# Patient Record
Sex: Female | Born: 1980
Health system: Southern US, Community
[De-identification: ages and names within clinical notes are randomized; demographics above are authoritative.]

## PROBLEM LIST (undated history)

## (undated) DIAGNOSIS — Z8489 Family history of other specified conditions: Secondary | ICD-10-CM

## (undated) DIAGNOSIS — L409 Psoriasis, unspecified: Secondary | ICD-10-CM

## (undated) DIAGNOSIS — Z9189 Other specified personal risk factors, not elsewhere classified: Secondary | ICD-10-CM

## (undated) DIAGNOSIS — K921 Melena: Secondary | ICD-10-CM

## (undated) HISTORY — DX: Other specified personal risk factors, not elsewhere classified: Z91.89

## (undated) HISTORY — DX: Melena: K92.1

## (undated) HISTORY — DX: Psoriasis, unspecified: L40.9

---
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
## 2006-07-22 ENCOUNTER — Emergency Department (HOSPITAL_COMMUNITY): Admission: EM | Admit: 2006-07-22 | Discharge: 2006-07-22 | Payer: Self-pay | Admitting: Family Medicine

## 2009-07-22 ENCOUNTER — Other Ambulatory Visit: Admission: RE | Admit: 2009-07-22 | Discharge: 2009-07-22 | Payer: Self-pay | Admitting: Family Medicine

## 2017-09-21 ENCOUNTER — Encounter: Payer: Self-pay | Admitting: Family Medicine

## 2017-09-21 ENCOUNTER — Ambulatory Visit: Payer: 59 | Admitting: Family Medicine

## 2017-09-21 VITALS — BP 105/72 | HR 65 | Resp 16 | Ht 62.0 in | Wt 151.0 lb

## 2017-09-21 DIAGNOSIS — R635 Abnormal weight gain: Secondary | ICD-10-CM | POA: Diagnosis not present

## 2017-09-21 DIAGNOSIS — Z7689 Persons encountering health services in other specified circumstances: Secondary | ICD-10-CM

## 2017-09-21 DIAGNOSIS — M791 Myalgia, unspecified site: Secondary | ICD-10-CM | POA: Diagnosis not present

## 2017-09-21 DIAGNOSIS — W57XXXA Bitten or stung by nonvenomous insect and other nonvenomous arthropods, initial encounter: Secondary | ICD-10-CM

## 2017-09-21 DIAGNOSIS — M2550 Pain in unspecified joint: Secondary | ICD-10-CM

## 2017-09-21 DIAGNOSIS — R5383 Other fatigue: Secondary | ICD-10-CM

## 2017-09-21 DIAGNOSIS — Z3009 Encounter for other general counseling and advice on contraception: Secondary | ICD-10-CM

## 2017-09-21 DIAGNOSIS — L409 Psoriasis, unspecified: Secondary | ICD-10-CM

## 2017-09-21 NOTE — Progress Notes (Signed)
Patient ID: Rebecca Pacheco, female  DOB: 02-10-1981, 37 y.o.   MRN: [id number] Patient Care Team    Relationship Specialty Notifications Start End  Ma Hillock, DO PCP - General Family Medicine  09/21/17     Chief Complaint  Patient presents with  . Establish Care    Subjective:  Rebecca Pacheco is a 37 y.o.  female present for new patient establishment. All past medical history, surgical history, allergies, family history, immunizations, medications and social history were updatedin the electronic medical record today. All recent labs, ED visits and hospitalizations within the last year were reviewed.  Fatigue, polyarthralgia, myalgia: Patient presents today with 6 months worsening symptoms of her psoriasis which she states she has had for years.  She has not wanted to take a daily med in the past.  No records provided today.  She reports she is fearful she has been exposed to Lyme disease secondary to multiple tick bites over the last few months.  Takes precautions when out in her yard, however she has pets which she feels the takes into the house.  She reports her knees ankles and arms are painful in the joints.  She also complains of feeling like she is "run a marathon "with her leg muscles being sore.  She denies fever, chills, headaches or rash other than her psoriasis.  She has a family history of lupus and she is worried she has lupus, or another autoimmune disorder.  By review of electronic medical record she had an ANA completed September 22, 2014 which was positive for titers of 1: 320 speckled pattern, Smith antibodies, Sjogren's antibodies, anti-scleroderma-70 antibodies, anti-DNA double-stranded antibodies and anti-Jo antibodies are all negative.    No flowsheet data found. No flowsheet data found.    No flowsheet data found.  There is no immunization history on file for this patient.  No exam data present  Past Medical History:  Diagnosis Date  . Blood in stool     anal tear  . History of fainting spells of unknown cause   . Psoriasis    Allergies  Allergen Reactions  . Poison [employer] and Itching  . Lidocaine Hcl Other (See Comments)    BP & Heart Rate "bottom out"  . Mango Flavor Rash    Rash around lips  . Poison Ivy Extract   . Proparacaine     hypotension   Past Surgical History:  Procedure Laterality Date  . TONSILLECTOMY  1987   Family History  Problem Relation Age of Onset  . Arthritis Mother   . Asthma Mother   . Hypertension Mother   . Hyperlipidemia Father   . Hearing loss Father   . Cancer Brother   . Hypertension Brother   . Early death Maternal Grandmother   . Early death Maternal Grandfather   . Cancer Maternal Grandfather   . Arthritis Paternal Grandmother   . Arthritis Paternal Grandfather   . Hearing loss Paternal Grandfather   . Heart disease Paternal Grandfather   . Hyperlipidemia Paternal Grandfather    Social History   Socioeconomic History  . Marital status: Single    Spouse name: Not on file  . Number of children: Not on file  . Years of education: Not on file  . Highest education level: Not on file  Occupational History  . Not on file  Social Needs  . Financial resource strain: Not on file  . Food insecurity:    Worry: Not on file  Inability: Not on file  . Transportation needs:    Medical: Not on file    Non-medical: Not on file  Tobacco Use  . Smoking status: Never Smoker  . Smokeless tobacco: Never Used  Substance and Sexual Activity  . Alcohol use: Yes    Alcohol/week: 1.2 oz    Types: 2 Glasses of wine per week  . Drug use: Never  . Sexual activity: Not on file  Lifestyle  . Physical activity:    Days per week: Not on file    Minutes per session: Not on file  . Stress: Not on file  Relationships  . Social connections:    Talks on phone: Not on file    Gets together: Not on file    Attends religious service: Not on file    Active member of club or organization:  Not on file    Attends meetings of clubs or organizations: Not on file    Relationship status: Not on file  . Intimate partner violence:    Fear of current or ex partner: Not on file    Emotionally abused: Not on file    Physically abused: Not on file    Forced sexual activity: Not on file  Other Topics Concern  . Not on file  Social History Narrative  . Not on file   Allergies as of 09/21/2017      Reactions   Poison Oak Extract Hives, Itching   Lidocaine Hcl Other (See Comments)   BP & Heart Rate "bottom out"   Mango Flavor Rash   Rash around lips   Poison Ivy Extract    Proparacaine    hypotension      Medication List        Accurate as of 09/21/17  5:48 PM. Always use your most recent med list.          [employer] FIBER GUMMIES PO Take by mouth.   desogestrel-ethinyl estradiol 0.15-30 MG-MCG tablet Commonly known as:  APRI,EMOQUETTE,SOLIA TAKE 1 TABLET BY MOUTH EVERY DAY   THERA Tabs Take by mouth.       All past medical history, surgical history, allergies, family history, immunizations andmedications were updated in the EMR today and reviewed under the history and medication portions of their EMR.    No results found for this or any previous visit (from the past 2160 hour(s)).  No results found.   ROS: 14 pt review of systems performed and negative (unless mentioned in an HPI)  Objective: BP 105/72 (BP Location: Right Arm, Patient Position: Sitting, Cuff Size: Normal)   Pulse 65   Resp 16   Ht 5' 2"  (1.575 m)   Wt 151 lb (68.5 kg)   LMP 09/09/2017 Comment: on birth ontrol  SpO2 100%   BMI 27.62 kg/m  Gen: Afebrile. No acute distress. Nontoxic in appearance, well-developed, well-nourished, pleasant Caucasian female. HENT: AT. [place]. Bilateral TM visualized and normal in appearance, normal external auditory canal. MMM, no oral lesions, adequate dentition. Bilateral nares within normal limits. Throat without erythema, ulcerations or exudates. no Cough on exam,  no hoarseness on exam. Eyes:Pupils Equal Round Reactive to light, Extraocular movements intact,  Conjunctiva without redness, discharge or icterus. Neck/lymp/endocrine: Supple,no lymphadenopathy, no thyromegaly CV: RRR no murmur, no edema Chest: CTAB, no wheeze, rhonchi or crackles.  Abd: Soft.NTND. BS present. Skin: no rashes, purpura or petechiae.  Dry flaky scalp present. warm and well-perfused. Skin intact. Neuro/Msk: Normal gait. PERLA. EOMi. Alert. Oriented x3.   Psych:  Normal affect, dress and demeanor. Normal speech. Normal thought content and judgment.   Assessment/plan: Summers Buendia is a 37 y.o. female present for EST care.  Myalgia/polyarthralgia/history of psoriasis/fatigue/weight gain -Lengthy discussion with her today surrounding her constellation of symptoms.  Could certainly be autoimmune disorder.  She reports psoriasis history.  She has a positive ANA 1: 320 speckled pattern reviewed in care everywhere.  Exam today is benign.  Will pursue further inflammatory autoimmune work-up and likely refer to rheumatology for further investigation on her symptoms. -Could consider daily anti-inflammatory versus Cymbalta, depending upon labs given patient's desire for medications, which she aexpressed not wanting to take a daily med.  - HLA-B27 Antigen - Sedimentation rate - C-reactive protein - TSH - Rheumatoid Factor - Cyclic citrul peptide antibody, IgG - CBC - B. burgdorfi antibodies - Comp Met (CMET) - Iron, TIBC and Ferritin Panel - Vitamin D (25 hydroxy) - B12 -Follow-up dependent upon lab results.  Family planning advice: -She is currently on birth control pills.  Pap is overdue.  She states that she desires to have a child.  Her husband is not quite ready yet.  He is currently living in [place] patient to become established with a OB/GYN in order to update her Pap and discuss family planning in more detail.  She has been on the birth control  pill for 15 years and may need assistance with pregnancy secondary to advanced maternal age.  She is agreeable to become established with OB/GYN.  Return if symptoms worsen or fail to improve.   Note is dictated utilizing voice recognition software. Although note has been proof read prior to signing, occasional typographical errors still can be missed. If any questions arise, please do not hesitate to call for verification.  Electronically signed by: Howard Pouch, DO [place]

## 2017-09-21 NOTE — Patient Instructions (Signed)
We will complete labs today. We will call you in about  A week with results. It was a pleasure meeting you today.  We may need to refer you to dermatology or rheumatology for further evaluation.   Psoriasis Psoriasis is a long-term (chronic) skin condition. It causes raised, red patches (plaques) on your skin that look silvery. The red patches may show up anywhere on your body. They can be any size or shape. Psoriasis can come and go. It can range from mild to very bad. It cannot be passed from one person to another (not contagious). There is no cure for this condition, but it can be helped with treatment. Follow these instructions at home: Skin Care  Apply moisturizers to your skin as needed. Only use those that your doctor has said are okay.  Apply cool compresses to the affected areas.  Do not scratch your skin. Lifestyle   Do not use tobacco products. This includes cigarettes, chewing tobacco, and e-cigarettes. If you need help quitting, ask your doctor.  Drink little or no alcohol.  Try to lower your stress. Meditation or yoga may help.  Get sun as told by your doctor. Do not get sunburned.  Think about joining a psoriasis support group. Medicines  Take or use over-the-counter and prescription medicines only as told by your doctor.  If you were prescribed an antibiotic, take or use it as told by your doctor. Do not stop taking the antibiotic even if your condition starts to get better. General instructions  Keep a journal. Use this to help track what triggers an outbreak. Try to avoid any triggers.  See a counselor or social worker if you feel very sad, upset, or hopeless about your condition and these feelings affect your work or relationships.  Keep all follow-up visits as told by your doctor. This is important. Contact a doctor if:  Your pain gets worse.  You have more redness or warmth in the affected areas.  You have new pain or stiffness in your  joints.  Your pain or stiffness in your joints gets worse.  Your nails start to break easily.  Your nails pull away from the nail bed easily.  You have a fever.  You feel very sad (depressed). This information is not intended to replace advice given to you by your health care provider. Make sure you discuss any questions you have with your health care provider. Document Released: 04/20/2004 Document Revised: 08/19/2015 Document Reviewed: 07/29/2014 Elsevier Interactive Patient Education  2018 [employer]

## 2017-09-24 LAB — CBC
HCT: 38 % (ref 35.0–45.0)
Hemoglobin: 12.8 g/dL (ref 11.7–15.5)
MCH: 30.3 pg (ref 27.0–33.0)
MCHC: 33.7 g/dL (ref 32.0–36.0)
MCV: 89.8 fL (ref 80.0–100.0)
MPV: 10.7 fL (ref 7.5–12.5)
Platelets: 300 10*3/uL (ref 140–400)
RBC: 4.23 10*6/uL (ref 3.80–5.10)
RDW: 12.2 % (ref 11.0–15.0)
WBC: 7.4 10*3/uL (ref 3.8–10.8)

## 2017-09-24 LAB — SEDIMENTATION RATE: Sed Rate: 34 mm/h — ABNORMAL HIGH (ref 0–20)

## 2017-09-24 LAB — COMPREHENSIVE METABOLIC PANEL
AG Ratio: 1.5 (calc) (ref 1.0–2.5)
ALT: 16 U/L (ref 6–29)
AST: 17 U/L (ref 10–30)
Albumin: 4.3 g/dL (ref 3.6–5.1)
Alkaline phosphatase (APISO): 56 U/L (ref 33–115)
BUN: 9 mg/dL (ref 7–25)
CO2: 21 mmol/L (ref 20–32)
Calcium: 9.7 mg/dL (ref 8.6–10.2)
Chloride: 103 mmol/L (ref 98–110)
Creat: 0.62 mg/dL (ref 0.50–1.10)
Globulin: 2.9 g/dL (calc) (ref 1.9–3.7)
Glucose, Bld: 88 mg/dL (ref 65–99)
Potassium: 4.2 mmol/L (ref 3.5–5.3)
Sodium: 138 mmol/L (ref 135–146)
Total Bilirubin: 0.3 mg/dL (ref 0.2–1.2)
Total Protein: 7.2 g/dL (ref 6.1–8.1)

## 2017-09-24 LAB — B. BURGDORFI ANTIBODIES: B burgdorferi Ab IgG+IgM: <0.9 index

## 2017-09-24 LAB — IRON,TIBC AND FERRITIN PANEL
%SAT: 13 % (calc) — ABNORMAL LOW (ref 16–45)
Ferritin: 44 ng/mL (ref 16–154)
Iron: 58 ug/dL (ref 40–190)
TIBC: 442 mcg/dL (calc) (ref 250–450)

## 2017-09-24 LAB — RHEUMATOID FACTOR: Rhuematoid fact SerPl-aCnc: <14 IU/mL (ref <14)

## 2017-09-24 LAB — HLA-B27 ANTIGEN: HLA-B27 Antigen: NEGATIVE

## 2017-09-24 LAB — VITAMIN B12: Vitamin B-12: 600 pg/mL (ref 200–1100)

## 2017-09-24 LAB — VITAMIN D 25 HYDROXY (VIT D DEFICIENCY, FRACTURES): Vit D, 25-Hydroxy: 37 ng/mL (ref 30–100)

## 2017-09-24 LAB — C-REACTIVE PROTEIN: CRP: 6.8 mg/L (ref <8.0)

## 2017-09-24 LAB — TSH: TSH: 3.05 mIU/L

## 2017-09-24 LAB — CYCLIC CITRUL PEPTIDE ANTIBODY, IGG: Cyclic Citrullin Peptide Ab: <16 UNITS

## 2017-10-01 ENCOUNTER — Telehealth: Payer: Self-pay | Admitting: Family Medicine

## 2017-10-01 DIAGNOSIS — R5383 Other fatigue: Secondary | ICD-10-CM

## 2017-10-01 DIAGNOSIS — M791 Myalgia, unspecified site: Secondary | ICD-10-CM

## 2017-10-01 DIAGNOSIS — R768 Other specified abnormal immunological findings in serum: Secondary | ICD-10-CM

## 2017-10-01 DIAGNOSIS — L409 Psoriasis, unspecified: Secondary | ICD-10-CM

## 2017-10-01 DIAGNOSIS — M2550 Pain in unspecified joint: Secondary | ICD-10-CM

## 2017-10-01 MED ORDER — MELOXICAM 15 MG PO TABS: 15.0000 mg | ORAL_TABLET | Freq: Every day | ORAL | 1 refills | Status: DC

## 2017-10-01 NOTE — Telephone Encounter (Signed)
Patient notified and verbalized understanding. Patient is in agreement to try the Mobic.

## 2017-10-01 NOTE — Telephone Encounter (Signed)
mobic once daily with food prescribed. Please make pt aware.

## 2017-10-01 NOTE — Telephone Encounter (Signed)
Please inform patient the following information: All of her labs are normal with the exception of one of her inflammatory makers called a sed rate is mildly elevated. With her complaints of myalgia/polyarthralgia, prior positive ANA and elevated sed rate--> I have referred her to rheumatology for further investigation into the causes of her symptoms.   Short term to possibly provide comfort we could consider daily antiinflammatory medication called mobic if she desires. Please advise

## 2017-10-02 NOTE — Telephone Encounter (Signed)
Patient notified

## 2019-05-15 ENCOUNTER — Institutional Professional Consult (permissible substitution): Payer: 59 | Admitting: Plastic Surgery

## 2019-05-29 ENCOUNTER — Ambulatory Visit: Payer: 59 | Admitting: Plastic Surgery

## 2019-05-29 ENCOUNTER — Other Ambulatory Visit: Payer: Self-pay

## 2019-05-29 ENCOUNTER — Encounter: Payer: Self-pay | Admitting: Plastic Surgery

## 2019-05-29 VITALS — BP 114/78 | HR 74 | Temp 98.0°F | Ht 62.0 in | Wt 147.8 lb

## 2019-05-29 DIAGNOSIS — L723 Sebaceous cyst: Secondary | ICD-10-CM | POA: Diagnosis not present
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
## 2019-06-26 ENCOUNTER — Ambulatory Visit: Payer: 59 | Admitting: Plastic Surgery

## 2019-06-26 ENCOUNTER — Encounter: Payer: Self-pay | Admitting: Plastic Surgery

## 2019-06-26 ENCOUNTER — Other Ambulatory Visit: Payer: Self-pay

## 2019-06-26 ENCOUNTER — Other Ambulatory Visit (HOSPITAL_COMMUNITY)
Admission: RE | Admit: 2019-06-26 | Discharge: 2019-06-26 | Disposition: A | Discharge status: Home / Self Care | Payer: 59 | Source: Ambulatory Visit | Attending: Plastic Surgery | Admitting: Plastic Surgery

## 2019-06-26 VITALS — BP 114/77 | HR 68 | Temp 98.0°F | Ht 62.0 in | Wt 148.0 lb

## 2019-06-26 DIAGNOSIS — L723 Sebaceous cyst: Secondary | ICD-10-CM | POA: Insufficient documentation

## 2019-06-26 NOTE — Progress Notes (Signed)
Operative Note   DATE OF OPERATION: 06/26/2019  LOCATION:    SURGICAL DEPARTMENT: Plastic Surgery  PREOPERATIVE DIAGNOSES: Glabellar cyst  POSTOPERATIVE DIAGNOSES:  same  PROCEDURE:  1. Excision of glabellar cyst measuring 1.5 cm 2. Complex closure measuring 1.5 cm  SURGEON: Ancil Linsey, MD  ANESTHESIA:  Local  COMPLICATIONS: None.   INDICATIONS FOR PROCEDURE:  The patient, Rebecca Pacheco is a 39 y.o. female born on 1980-11-24, is here for treatment of symptomatic glabellar cyst MRN: [id number]  CONSENT:  Informed consent was obtained directly from the patient. Risks, benefits and alternatives were fully discussed. Specific risks including but not limited to bleeding, infection, hematoma, seroma, scarring, pain, infection, wound healing problems, and need for further surgery were all discussed. The patient did have an ample opportunity to have questions answered to satisfaction.   DESCRIPTION OF PROCEDURE:  Local anesthesia was administered. The patient's operative site was prepped and draped in a sterile fashion. A time out was performed and all information was confirmed to be correct.  The lesion was excised with a 15 blade.  Hemostasis was obtained.  Circumferential undermining was performed and the skin was advanced and closed in layers with interrupted buried Monocryl sutures and 5-0 fast gut for the skin.  The lesion excised measured 1.5 cm, and the total length of closure measured 1.5 cm.    The patient tolerated the procedure well.  There were no complications.

## 2019-06-27 LAB — SURGICAL PATHOLOGY

## 2019-07-10 ENCOUNTER — Ambulatory Visit: Payer: 59 | Admitting: Plastic Surgery

## 2019-09-15 ENCOUNTER — Encounter: Payer: Self-pay | Admitting: Podiatry

## 2019-09-15 ENCOUNTER — Ambulatory Visit: Payer: 59 | Admitting: Podiatry

## 2019-09-15 ENCOUNTER — Other Ambulatory Visit: Payer: Self-pay

## 2019-09-15 ENCOUNTER — Ambulatory Visit (INDEPENDENT_AMBULATORY_CARE_PROVIDER_SITE_OTHER): Payer: 59

## 2019-09-15 DIAGNOSIS — M205X2 Other deformities of toe(s) (acquired), left foot: Secondary | ICD-10-CM

## 2019-09-15 DIAGNOSIS — M7752 Other enthesopathy of left foot: Secondary | ICD-10-CM | POA: Insufficient documentation

## 2019-09-15 DIAGNOSIS — M7751 Other enthesopathy of right foot: Secondary | ICD-10-CM | POA: Diagnosis not present

## 2019-09-15 DIAGNOSIS — M19072 Primary osteoarthritis, left ankle and foot: Secondary | ICD-10-CM | POA: Diagnosis not present

## 2019-09-15 MED ORDER — MELOXICAM 15 MG PO TABS
15.0000 mg | ORAL_TABLET | Freq: Every day | ORAL | 0 refills | Status: DC
Start: 2019-09-15 — End: 2020-03-18

## 2019-09-15 NOTE — Progress Notes (Signed)
This patient presents to the office with chief complaint of pain on the inside of both ankles.   This patient says she has been experiencing pain on the inside of left ankle than right.  She says she has started running which has caused her pain.  She says her left ankle is 8/9 out of 10 when her ankle is painful.  Her right ankle is 2/3   She points to the area between her inside ankle bone and achilles tendon.  She denies trauma or injury.  She has cut back her activities and her ankle has improved.    She presents to the office for evaluation and treatment.  Vascular  Dorsalis pedis and posterior tibial pulses are palpable  B/L.  Capillary return  WNL.  Temperature gradient is  WNL.  Skin turgor  WNL  Sensorium  Senn Weinstein monofilament wire  WNL. Normal tactile sensation.  Muscle power WNL except weakness on inversion left foot.  Nail Exam  Patient has normal nails with no evidence of bacterial or fungal infection.  Orthopedic  Exam  Muscle tone and muscle strength  WNL.  No limitations of motion feet  B/L.  No crepitus or joint effusion noted.  Foot type is unremarkable and digits show no abnormalities.  Hallux limitus 1st MPJ  B/L. Excessive pronation upon weight bearing. Pain along the PTT behind medial malleolus    Left greater than right.  Plantar flexed hallux position 1st MPJ left with hammer toes 2-4  Left.  Skin  No open lesions.  Normal skin texture and turgor.  Ankle tendinitis  B/L.  Hallux limitus 1st MPJ  B/L.  IE.  Xrays taken reveal no bony pathology left ankle.  Mild dorsal crowning 1st MPJ  Left.  Discussed condition with this patient.    Patient has excessive  pronation with hallux limitus 1st MPJ left.  Prescribe  Mobic 15 mg  # 30.  Dispense powerstep insoles.  RTC 10 days.     Helane Gunther DPM

## 2019-09-25 ENCOUNTER — Other Ambulatory Visit: Payer: Self-pay

## 2019-09-25 ENCOUNTER — Ambulatory Visit: Payer: 59 | Admitting: Podiatry

## 2019-09-25 DIAGNOSIS — M7752 Other enthesopathy of left foot: Secondary | ICD-10-CM | POA: Diagnosis not present

## 2019-09-25 DIAGNOSIS — M7751 Other enthesopathy of right foot: Secondary | ICD-10-CM

## 2019-09-25 DIAGNOSIS — M205X2 Other deformities of toe(s) (acquired), left foot: Secondary | ICD-10-CM | POA: Diagnosis not present

## 2019-09-25 NOTE — Progress Notes (Signed)
This patient presents the office for continued evaluation of her ankles.  She was seen approximately 10 to 14 days ago and diagnosed with a tendinitis on her ankles.  She also was diagnosed with a functional hallux limitus.  She was dispensed power step insoles which she has worn and she says that her feet are doing much better using the power step insoles.  She says  she is using them when she exercises and only once  She has  experiences pain with the power step insoles when she walked 5 miles.  She says she has not taken the Mobic which was prescribed since she is now pregnant.  She is very pleased with her improvement and presents the office today for follow-up of evaluation and treatment.  Vascular  Dorsalis pedis and posterior tibial pulses are palpable  B/L.  Capillary return  WNL.  Temperature gradient is  WNL.  Skin turgor  WNL  Sensorium  Senn Weinstein monofilament wire  WNL. Normal tactile sensation.  Nail Exam  Patient has normal nails with no evidence of bacterial or fungal infection.  Orthopedic  Exam  Muscle tone and muscle strength  WNL.  No limitations of motion feet  B/L.  No crepitus or joint effusion noted.  Foot type is unremarkable and digits show no abnormalities.  Hallux limitus 1st MPJ  B/L.  Pain resolved along PTT  B/L.    Skin  No open lesions.  Normal skin texture and turgor.  PTT tendinitis.  Hallux limitus  1st MPJ  B/L.  ROV.  Discussed continued treatment for this patient and told her to make an appointment with the pedorthist  to acquire a kinetic wedge orthoses to be worn in her shoes.  Return to clinic as needed  Helane Gunther DPM

## 2019-10-14 ENCOUNTER — Other Ambulatory Visit: Payer: 59 | Admitting: Orthotics

## 2019-10-23 LAB — OB RESULTS CONSOLE HIV ANTIBODY (ROUTINE TESTING): HIV: NONREACTIVE

## 2019-10-23 LAB — OB RESULTS CONSOLE RPR: RPR: NONREACTIVE

## 2019-10-23 LAB — OB RESULTS CONSOLE ABO/RH: RH Type: POSITIVE

## 2019-10-23 LAB — OB RESULTS CONSOLE RUBELLA ANTIBODY, IGM: Rubella: IMMUNE

## 2019-10-23 LAB — OB RESULTS CONSOLE HEPATITIS B SURFACE ANTIGEN: Hepatitis B Surface Ag: NEGATIVE

## 2019-11-13 LAB — OB RESULTS CONSOLE GC/CHLAMYDIA
Chlamydia: NEGATIVE
Gonorrhea: NEGATIVE

## 2019-12-26 ENCOUNTER — Ambulatory Visit: Payer: Self-pay

## 2019-12-26 ENCOUNTER — Other Ambulatory Visit: Payer: Self-pay

## 2019-12-26 ENCOUNTER — Ambulatory Visit: Payer: 59 | Admitting: Podiatry

## 2019-12-26 ENCOUNTER — Other Ambulatory Visit: Payer: Self-pay | Admitting: Occupational Medicine

## 2019-12-26 DIAGNOSIS — M25571 Pain in right ankle and joints of right foot: Secondary | ICD-10-CM

## 2020-03-18 ENCOUNTER — Inpatient Hospital Stay (HOSPITAL_COMMUNITY)
Admission: AD | Admit: 2020-03-18 | Discharge: 2020-03-18 | Disposition: A | Discharge status: Home / Self Care | Payer: 59 | Attending: Obstetrics and Gynecology | Admitting: Obstetrics and Gynecology

## 2020-03-18 ENCOUNTER — Inpatient Hospital Stay (HOSPITAL_BASED_OUTPATIENT_CLINIC_OR_DEPARTMENT_OTHER): Payer: 59

## 2020-03-18 ENCOUNTER — Encounter (HOSPITAL_COMMUNITY): Payer: Self-pay | Admitting: Obstetrics and Gynecology

## 2020-03-18 DIAGNOSIS — O99891 Other specified diseases and conditions complicating pregnancy: Secondary | ICD-10-CM

## 2020-03-18 DIAGNOSIS — Z791 Long term (current) use of non-steroidal anti-inflammatories (NSAID): Secondary | ICD-10-CM | POA: Diagnosis not present

## 2020-03-18 DIAGNOSIS — M25511 Pain in right shoulder: Secondary | ICD-10-CM | POA: Diagnosis not present

## 2020-03-18 DIAGNOSIS — S4991XA Unspecified injury of right shoulder and upper arm, initial encounter: Secondary | ICD-10-CM | POA: Insufficient documentation

## 2020-03-18 DIAGNOSIS — Z3A30 30 weeks gestation of pregnancy: Secondary | ICD-10-CM

## 2020-03-18 DIAGNOSIS — S3981XA Other specified injuries of abdomen, initial encounter: Secondary | ICD-10-CM | POA: Diagnosis not present

## 2020-03-18 DIAGNOSIS — Z3689 Encounter for other specified antenatal screening: Secondary | ICD-10-CM

## 2020-03-18 DIAGNOSIS — R109 Unspecified abdominal pain: Secondary | ICD-10-CM | POA: Diagnosis not present

## 2020-03-18 DIAGNOSIS — R102 Pelvic and perineal pain: Secondary | ICD-10-CM | POA: Diagnosis not present

## 2020-03-18 DIAGNOSIS — O26899 Other specified pregnancy related conditions, unspecified trimester: Secondary | ICD-10-CM

## 2020-03-18 DIAGNOSIS — R1032 Left lower quadrant pain: Secondary | ICD-10-CM | POA: Diagnosis not present

## 2020-03-18 DIAGNOSIS — Y92410 Unspecified street and highway as the place of occurrence of the external cause: Secondary | ICD-10-CM | POA: Insufficient documentation

## 2020-03-18 MED ORDER — ACETAMINOPHEN 500 MG PO TABS
1000.0000 mg | ORAL_TABLET | Freq: Four times a day (QID) | ORAL | Status: DC | PRN
  Administered 2020-03-18: 1000 mg via ORAL
  Filled 2020-03-18: qty 2

## 2020-03-18 MED ORDER — LACTATED RINGERS IV BOLUS
1000.0000 mL | Freq: Once | INTRAVENOUS | Status: AC
  Administered 2020-03-18: 1000 mL via INTRAVENOUS
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
## 2020-03-18 NOTE — Discharge Instructions (Signed)
Fetal Movement Counts Patient Name: ________________________________________________ Patient Due Date: ____________________ What is a fetal movement count?  A fetal movement count is the number of times that you feel your baby move during a certain amount of time. This may also be called a fetal kick count. A fetal movement count is recommended for every pregnant woman. You may be asked to start counting fetal movements as early as week 28 of your pregnancy. Pay attention to when your baby is most active. You may notice your baby's sleep and wake cycles. You may also notice things that make your baby move more. You should do a fetal movement count:  When your baby is normally most active.  At the same time each day. A good time to count movements is while you are resting, after having something to eat and drink. How do I count fetal movements? 1. Find a quiet, comfortable area. Sit, or lie down on your side. 2. Write down the date, the start time and stop time, and the number of movements that you felt between those two times. Take this information with you to your health care visits. 3. Write down your start time when you feel the first movement. 4. Count kicks, flutters, swishes, rolls, and jabs. You should feel at least 10 movements. 5. You may stop counting after you have felt 10 movements, or if you have been counting for 2 hours. Write down the stop time. 6. If you do not feel 10 movements in 2 hours, contact your health care provider for further instructions. Your health care provider may want to do additional tests to assess your baby's well-being. Contact a health care provider if:  You feel fewer than 10 movements in 2 hours.  Your baby is not moving like he or she usually does. Date: ____________ Start time: ____________ Stop time: ____________ Movements: ____________ Date: ____________ Start time: ____________ Stop time: ____________ Movements: ____________ Date: ____________  Start time: ____________ Stop time: ____________ Movements: ____________ Date: ____________ Start time: ____________ Stop time: ____________ Movements: ____________ Date: ____________ Start time: ____________ Stop time: ____________ Movements: ____________ Date: ____________ Start time: ____________ Stop time: ____________ Movements: ____________ Date: ____________ Start time: ____________ Stop time: ____________ Movements: ____________ Date: ____________ Start time: ____________ Stop time: ____________ Movements: ____________ Date: ____________ Start time: ____________ Stop time: ____________ Movements: ____________ This information is not intended to replace advice given to you by your health care provider. Make sure you discuss any questions you have with your health care provider. Document Revised: 10/31/2018 Document Reviewed: 10/31/2018 Elsevier Patient Education  2020 [employer] Inc[employer]   Preventing Injuries During Pregnancy  Injuries can happen during pregnancy. Minor falls and accidents usually do not harm you or your baby. But some injuries can harm you and your baby. Tell your doctor about any injury you suffer. What can I do to avoid injuries? Safety  Remove rugs and loose objects on the floor.  Wear comfortable shoes that have a good grip. Do not wear shoes that have high heels.  Always wear your seat belt in the car. The lap belt should be below your belly. Always drive safely.  Do not ride on a motorcycle. Activity  Do not take part in rough and violent activities or sports.  Avoid: ? Walking on wet or slippery floors. ? Lifting heavy pots of boiling or hot liquids. ? Fixing electrical problems. ? Being near fires. General instructions  Take over-the-counter and prescription medicines only as told by your doctor.  Know your   blood type and the blood type of the baby's father.  If you are a victim of domestic violence: ? Call your local emergency services (911 in the  [place] ? Contact the [employer] Violence Hotline for help and support. Get help right away if:  You fall on your belly or receive any serious blow to your belly.  You have a stiff neck or neck pain after a fall or an injury.  You get a headache or have problems with vision after an injury.  You do not feel the baby move or the baby is not moving as much as normal.  You have been a victim of domestic violence or any other kind of attack.  You have been in a car accident.  You have bleeding from your vagina.  Fluid is leaking from your vagina.  You start to have cramping or pain in your belly (contractions).  You have very bad pain in your lower back.  You feel weak or pass out (faint).  You start to throw up (vomit) after an injury.  You have been burned. Summary  Some injuries that happen during pregnancy can do harm to the baby.  Tell your doctor about any injury.  Take steps to avoid injury. This includes removing rugs and loose objects on the floor. Always wear your seat belt in the car.  Do not take part in rough and violent activities or sports.  Get help right away if you have any serious accident or injury. This information is not intended to replace advice given to you by your health care provider. Make sure you discuss any questions you have with your health care provider. Document Revised: 12/06/2018 Document Reviewed: 03/22/2016 Elsevier Patient Education  2020 [employer].
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
## 2020-04-27 LAB — OB RESULTS CONSOLE GBS: GBS: NEGATIVE

## 2020-05-04 ENCOUNTER — Encounter (HOSPITAL_COMMUNITY): Payer: Self-pay | Admitting: *Deleted

## 2020-05-04 NOTE — Patient Instructions (Signed)
Vickey Boak  05/04/2020   Your procedure is scheduled on:  05/15/2020  Arrive at 0730 at [employer] [facility] on [address] at [facility]  and [employer]. You are invited to use the FREE valet parking or use the Visitor's parking deck.  Pick up the phone at the desk and dial [phone].  Call this number if you have problems the morning of surgery: [phone]  Remember:   Do not eat food:(After Midnight) Desps de medianoche.  Do not drink clear liquids: (After Midnight) Desps de medianoche.  Take these medicines the morning of surgery with A SIP OF WATER:  none   Do not wear jewelry, make-up or nail polish.  Do not wear lotions, powders, or perfumes. Do not wear deodorant.  Do not shave 48 hours prior to surgery.  Do not bring valuables to the hospital.  [facility] is not   responsible for any belongings or valuables brought to the hospital.  Contacts, dentures or bridgework may not be worn into surgery.  Leave suitcase in the car. After surgery it may be brought to your room.  For patients admitted to the hospital, checkout time is 11:00 AM the day of              discharge.      Please read over the following fact sheets that you were given:     Preparing for Surgery

## 2020-05-05 ENCOUNTER — Telehealth (HOSPITAL_COMMUNITY): Payer: Self-pay | Admitting: *Deleted

## 2020-05-05 NOTE — Telephone Encounter (Signed)
Preadmission screen  

## 2020-05-06 ENCOUNTER — Encounter (HOSPITAL_COMMUNITY): Payer: Self-pay

## 2020-05-12 ENCOUNTER — Other Ambulatory Visit: Payer: Self-pay | Admitting: Obstetrics and Gynecology

## 2020-05-12 NOTE — H&P (Signed)
Rebecca Pacheco is a 40 y.o. female G1P0 at 38 weeks and 0 days presenting for primary cesarean section due to breech presentation. Pregnancy has been otherwise uncomplicated.. Prenatal care provided by Dr. Gerald Leitz with [facility].    OB History    Gravida  1   Para  0   Term      Preterm      AB  0   Living  0     SAB      IAB      Ectopic      Multiple      Live Births             Past Medical History:  Diagnosis Date  . Blood in stool    anal tear  . Family history of adverse reaction to anesthesia    difficulty intubation  . History of fainting spells of unknown cause   . Psoriasis    Past Surgical History:  Procedure Laterality Date  . TONSILLECTOMY  1987   Family History: family history includes Arthritis in her mother, paternal grandfather, and paternal grandmother; Asthma in her mother; Cancer in her brother and maternal grandfather; Early death in her maternal grandfather and maternal grandmother; Hearing loss in her father and paternal grandfather; Heart disease in her paternal grandfather; Hyperlipidemia in her father and paternal grandfather; Hypertension in her brother and mother. Social History:  reports that she has never smoked. She has never used smokeless tobacco. She reports current alcohol use of about 2.0 standard drinks of alcohol per week. She reports that she does not use drugs.     Maternal Diabetes: No Genetic Screening: Normal Maternal Ultrasounds/Referrals: Normal Fetal Ultrasounds or other Referrals:  None Maternal Substance Abuse:  No Significant Maternal Medications:  Meds include: Other:  Significant Maternal Lab Results:  Group B Strep negative Other Comments:  None  Review of Systems  Constitutional: Negative.   HENT: Negative.   Eyes: Negative.   Respiratory: Negative.   Cardiovascular: Negative.   Gastrointestinal: Negative.   Endocrine: Negative.   Genitourinary: Negative.   Musculoskeletal: Negative.    Skin: Negative.   Allergic/Immunologic: Negative.   Neurological: Negative.   Hematological: Negative.   Psychiatric/Behavioral: Negative.    History   Last menstrual period 05/29/2019. Maternal Exam:  Introitus: Normal vulva.   Physical Exam Vitals reviewed.  Constitutional:      Appearance: Normal appearance.  HENT:     Head: Normocephalic.  Eyes:     Pupils: Pupils are equal, round, and reactive to light.  Cardiovascular:     Rate and Rhythm: Normal rate.     Pulses: Normal pulses.     Heart sounds: Normal heart sounds.  Pulmonary:     Effort: Pulmonary effort is normal.     Breath sounds: Normal breath sounds.  Abdominal:     Tenderness: There is no abdominal tenderness.  Genitourinary:    General: Normal vulva.  Musculoskeletal:        General: Normal range of motion.     Cervical back: Normal range of motion and neck supple.  Skin:    General: Skin is warm and dry.  Neurological:     General: No focal deficit present.     Mental Status: She is alert and oriented to person, place, and time.     Prenatal labs: ABO, Rh: B/Positive/-- (07/29 0000) Antibody:  Negative  Rubella: Immune (07/29 0000) RPR: Nonreactive (07/29 0000)  HBsAg: Negative (07/29 0000)  HIV: Non-reactive (  07/29 0000)  GBS: Negative/-- (02/01 0000)   Assessment/Plan: 39 wks and 0 days with Breech presentation recommend cesarean section.  -. R/B/A of cesarean section discussed with the patient including but not limited to infection, bleeding damage to bowel bladder and baby with the need for further surgery. R/O transfusion HIV/ Hep B&C discussed. Pt voiced understanding and desires to proceed with cesarean section.    Gerald Leitz 05/12/2020, 7:26 PM

## 2020-05-13 ENCOUNTER — Encounter (HOSPITAL_COMMUNITY)
Admission: RE | Admit: 2020-05-13 | Discharge: 2020-05-13 | Disposition: A | Discharge status: Home / Self Care | Payer: 59 | Source: Ambulatory Visit | Attending: Obstetrics and Gynecology | Admitting: Obstetrics and Gynecology

## 2020-05-13 ENCOUNTER — Other Ambulatory Visit: Payer: Self-pay

## 2020-05-13 ENCOUNTER — Other Ambulatory Visit (HOSPITAL_COMMUNITY)
Admission: RE | Admit: 2020-05-13 | Discharge: 2020-05-13 | Disposition: A | Discharge status: Home / Self Care | Payer: 59 | Source: Ambulatory Visit | Attending: Obstetrics and Gynecology | Admitting: Obstetrics and Gynecology

## 2020-05-13 DIAGNOSIS — Z20822 Contact with and (suspected) exposure to covid-19: Secondary | ICD-10-CM | POA: Insufficient documentation

## 2020-05-13 DIAGNOSIS — Z01812 Encounter for preprocedural laboratory examination: Secondary | ICD-10-CM | POA: Insufficient documentation

## 2020-05-13 HISTORY — DX: Family history of other specified conditions: Z84.89

## 2020-05-13 LAB — TYPE AND SCREEN
ABO/RH(D): B POS
Antibody Screen: NEGATIVE

## 2020-05-13 LAB — SARS CORONAVIRUS 2 (TAT 6-24 HRS): SARS Coronavirus 2: NEGATIVE

## 2020-05-13 LAB — CBC
HCT: 35.2 % — ABNORMAL LOW (ref 36.0–46.0)
Hemoglobin: 11.6 g/dL — ABNORMAL LOW (ref 12.0–15.0)
MCH: 30.1 pg (ref 26.0–34.0)
MCHC: 33 g/dL (ref 30.0–36.0)
MCV: 91.2 fL (ref 80.0–100.0)
Platelets: 219 10*3/uL (ref 150–400)
RBC: 3.86 MIL/uL — ABNORMAL LOW (ref 3.87–5.11)
RDW: 13.2 % (ref 11.5–15.5)
WBC: 8.8 10*3/uL (ref 4.0–10.5)
nRBC: 0 % (ref 0.0–0.2)

## 2020-05-14 LAB — RPR: RPR Ser Ql: NONREACTIVE

## 2020-05-14 NOTE — Anesthesia Preprocedure Evaluation (Addendum)
Anesthesia Evaluation  Patient identified by MRN, date of birth, ID band Patient awake    Reviewed: Allergy & Precautions, NPO status , Patient's Chart, lab work & pertinent test results  History of Anesthesia Complications (+) history of anesthetic complications (HR and BP dropped w/ topical anesthesia to eye; has had lidocaine since then for lipoma removal in plastics office )  Airway Mallampati: I  TM Distance: >3 FB Neck ROM: Full    Dental no notable dental hx.    Pulmonary neg pulmonary ROS,    Pulmonary exam normal breath sounds clear to auscultation       Cardiovascular negative cardio ROS Normal cardiovascular exam Rhythm:Regular Rate:Normal     Neuro/Psych negative neurological ROS  negative psych ROS   GI/Hepatic negative GI ROS, Neg liver ROS,   Endo/Other  negative endocrine ROS  Renal/GU negative Renal ROS  negative genitourinary   Musculoskeletal negative musculoskeletal ROS (+)   Abdominal   Peds negative pediatric ROS (+)  Hematology H/H 11.6/38.2, plt 219   Anesthesia Other Findings   Reproductive/Obstetrics (+) Pregnancy Breech presentation                           Anesthesia Physical Anesthesia Plan  ASA: II  Anesthesia Plan: Spinal   Post-op Pain Management:    Induction:   PONV Risk Score and Plan: 3 and Ondansetron, Dexamethasone and Treatment may vary due to age or medical condition  Airway Management Planned: Natural Airway and Nasal Cannula  Additional Equipment: None  Intra-op Plan:   Post-operative Plan:   Informed Consent: I have reviewed the patients History and Physical, chart, labs and discussed the procedure including the risks, benefits and alternatives for the proposed anesthesia with the patient or authorized representative who has indicated his/her understanding and acceptance.       Plan Discussed with: CRNA  Anesthesia Plan  Comments: (Allergy listed to lidocaine + proparacaine- per pt, needed topical anesthesia for removal of foreign body from eye when she was in college and her HR and BP dropped w/ proparacaine, was told to never have "any -caines" again (sounds more like a vagal response). Since then has had removal of superficial mass in plastics office w/ local anesthesia- no problems. )      Anesthesia Quick Evaluation

## 2020-05-15 ENCOUNTER — Inpatient Hospital Stay (HOSPITAL_COMMUNITY): Payer: 59 | Admitting: Anesthesiology

## 2020-05-15 ENCOUNTER — Encounter (HOSPITAL_COMMUNITY): Payer: Self-pay | Admitting: Obstetrics and Gynecology

## 2020-05-15 ENCOUNTER — Encounter (HOSPITAL_COMMUNITY)
Admission: RE | Disposition: A | Discharge status: Home / Self Care | Payer: Self-pay | Source: Home / Self Care | Attending: Obstetrics and Gynecology

## 2020-05-15 ENCOUNTER — Inpatient Hospital Stay (HOSPITAL_COMMUNITY)
Admission: RE | Admit: 2020-05-15 | Discharge: 2020-05-18 | DRG: 788 | Disposition: A | Discharge status: Home / Self Care | Payer: 59 | Attending: Obstetrics and Gynecology | Admitting: Obstetrics and Gynecology

## 2020-05-15 ENCOUNTER — Other Ambulatory Visit: Payer: Self-pay

## 2020-05-15 DIAGNOSIS — O321XX0 Maternal care for breech presentation, not applicable or unspecified: Secondary | ICD-10-CM | POA: Diagnosis present

## 2020-05-15 DIAGNOSIS — Z3A39 39 weeks gestation of pregnancy: Secondary | ICD-10-CM | POA: Diagnosis not present

## 2020-05-15 DIAGNOSIS — Z20822 Contact with and (suspected) exposure to covid-19: Secondary | ICD-10-CM | POA: Diagnosis present

## 2020-05-15 SURGERY — Surgical Case
Anesthesia: Spinal

## 2020-05-15 MED ORDER — OXYCODONE HCL 5 MG/5ML PO SOLN: 5.0000 mg | Freq: Once | ORAL | Status: DC | PRN

## 2020-05-15 MED ORDER — DIPHENHYDRAMINE HCL 25 MG PO CAPS: 25.0000 mg | ORAL_CAPSULE | Freq: Four times a day (QID) | ORAL | Status: DC | PRN

## 2020-05-15 MED ORDER — NALBUPHINE HCL 10 MG/ML IJ SOLN
5.0000 mg | Freq: Once | INTRAMUSCULAR | Status: DC | PRN
Start: 2020-05-15 — End: 2020-05-18

## 2020-05-15 MED ORDER — ONDANSETRON HCL 4 MG/2ML IJ SOLN
INTRAMUSCULAR | Status: AC
  Filled 2020-05-15: qty 2

## 2020-05-15 MED ORDER — FENTANYL CITRATE (PF) 100 MCG/2ML IJ SOLN
INTRAMUSCULAR | Status: DC | PRN
  Administered 2020-05-15: 15 ug via INTRATHECAL

## 2020-05-15 MED ORDER — TETANUS-DIPHTH-ACELL PERTUSSIS 5-2.5-18.5 LF-MCG/0.5 IM SUSY: 0.5000 mL | PREFILLED_SYRINGE | Freq: Once | INTRAMUSCULAR | Status: DC

## 2020-05-15 MED ORDER — ONDANSETRON HCL 4 MG/2ML IJ SOLN
INTRAMUSCULAR | Status: DC | PRN
  Administered 2020-05-15: 4 mg via INTRAVENOUS

## 2020-05-15 MED ORDER — POVIDONE-IODINE 10 % EX SWAB: 2.0000 "application " | Freq: Once | CUTANEOUS | Status: DC

## 2020-05-15 MED ORDER — SCOPOLAMINE 1 MG/3DAYS TD PT72: 1.0000 | MEDICATED_PATCH | Freq: Once | TRANSDERMAL | Status: DC

## 2020-05-15 MED ORDER — MORPHINE SULFATE (PF) 0.5 MG/ML IJ SOLN
INTRAMUSCULAR | Status: AC
  Filled 2020-05-15: qty 10

## 2020-05-15 MED ORDER — ACETAMINOPHEN 500 MG PO TABS
1000.0000 mg | ORAL_TABLET | Freq: Four times a day (QID) | ORAL | Status: AC
  Administered 2020-05-15 – 2020-05-16 (×3): 1000 mg via ORAL
  Filled 2020-05-15 (×3): qty 2

## 2020-05-15 MED ORDER — DEXAMETHASONE SODIUM PHOSPHATE 10 MG/ML IJ SOLN
INTRAMUSCULAR | Status: AC
  Filled 2020-05-15: qty 1

## 2020-05-15 MED ORDER — SODIUM CHLORIDE 0.9% FLUSH: 3.0000 mL | INTRAVENOUS | Status: DC | PRN

## 2020-05-15 MED ORDER — DEXAMETHASONE SODIUM PHOSPHATE 10 MG/ML IJ SOLN
INTRAMUSCULAR | Status: DC | PRN
  Administered 2020-05-15: 10 mg via INTRAVENOUS

## 2020-05-15 MED ORDER — DIBUCAINE (PERIANAL) 1 % EX OINT: 1.0000 "application " | TOPICAL_OINTMENT | CUTANEOUS | Status: DC | PRN

## 2020-05-15 MED ORDER — FENTANYL CITRATE (PF) 100 MCG/2ML IJ SOLN
INTRAMUSCULAR | Status: AC
  Filled 2020-05-15: qty 2

## 2020-05-15 MED ORDER — NALBUPHINE HCL 10 MG/ML IJ SOLN: 5.0000 mg | Freq: Once | INTRAMUSCULAR | Status: DC | PRN

## 2020-05-15 MED ORDER — DIPHENHYDRAMINE HCL 25 MG PO CAPS: 25.0000 mg | ORAL_CAPSULE | ORAL | Status: DC | PRN

## 2020-05-15 MED ORDER — LACTATED RINGERS IV SOLN: INTRAVENOUS | Status: DC

## 2020-05-15 MED ORDER — IBUPROFEN 800 MG PO TABS
800.0000 mg | ORAL_TABLET | Freq: Three times a day (TID) | ORAL | Status: DC
  Administered 2020-05-15 – 2020-05-18 (×8): 800 mg via ORAL
  Filled 2020-05-15 (×8): qty 1

## 2020-05-15 MED ORDER — ACETAMINOPHEN 500 MG PO TABS
1000.0000 mg | ORAL_TABLET | Freq: Three times a day (TID) | ORAL | Status: DC
Start: 2020-05-15 — End: 2020-05-15

## 2020-05-15 MED ORDER — NALBUPHINE HCL 10 MG/ML IJ SOLN: 5.0000 mg | INTRAMUSCULAR | Status: DC | PRN

## 2020-05-15 MED ORDER — CEFAZOLIN SODIUM-DEXTROSE 2-4 GM/100ML-% IV SOLN
INTRAVENOUS | Status: AC
  Filled 2020-05-15: qty 100

## 2020-05-15 MED ORDER — PHENYLEPHRINE HCL-NACL 20-0.9 MG/250ML-% IV SOLN
INTRAVENOUS | Status: DC | PRN
  Administered 2020-05-15: 60 ug/min via INTRAVENOUS

## 2020-05-15 MED ORDER — BUPIVACAINE IN DEXTROSE 0.75-8.25 % IT SOLN
INTRATHECAL | Status: DC | PRN
  Administered 2020-05-15: 1.7 mL via INTRATHECAL

## 2020-05-15 MED ORDER — SOD CITRATE-CITRIC ACID 500-334 MG/5ML PO SOLN
ORAL | Status: AC
  Filled 2020-05-15: qty 30

## 2020-05-15 MED ORDER — OXYTOCIN-SODIUM CHLORIDE 30-0.9 UT/500ML-% IV SOLN
INTRAVENOUS | Status: AC
  Filled 2020-05-15: qty 500

## 2020-05-15 MED ORDER — MEPERIDINE HCL 25 MG/ML IJ SOLN: 6.2500 mg | INTRAMUSCULAR | Status: DC | PRN

## 2020-05-15 MED ORDER — SODIUM CHLORIDE 0.9 % IR SOLN
Status: DC | PRN
  Administered 2020-05-15: 1

## 2020-05-15 MED ORDER — WITCH HAZEL-GLYCERIN EX PADS: 1.0000 "application " | MEDICATED_PAD | CUTANEOUS | Status: DC | PRN

## 2020-05-15 MED ORDER — OXYTOCIN-SODIUM CHLORIDE 30-0.9 UT/500ML-% IV SOLN
2.5000 [IU]/h | INTRAVENOUS | Status: AC
  Administered 2020-05-15: 2.5 [IU]/h via INTRAVENOUS

## 2020-05-15 MED ORDER — OXYCODONE-ACETAMINOPHEN 5-325 MG PO TABS: 1.0000 | ORAL_TABLET | ORAL | Status: DC | PRN

## 2020-05-15 MED ORDER — KETOROLAC TROMETHAMINE 30 MG/ML IJ SOLN
INTRAMUSCULAR | Status: AC
  Filled 2020-05-15: qty 1

## 2020-05-15 MED ORDER — KETOROLAC TROMETHAMINE 30 MG/ML IJ SOLN: 30.0000 mg | Freq: Four times a day (QID) | INTRAMUSCULAR | Status: DC | PRN

## 2020-05-15 MED ORDER — COCONUT OIL OIL
1.0000 "application " | TOPICAL_OIL | Status: DC | PRN
  Administered 2020-05-16: 1 via TOPICAL

## 2020-05-15 MED ORDER — CEFAZOLIN SODIUM-DEXTROSE 2-4 GM/100ML-% IV SOLN
2.0000 g | INTRAVENOUS | Status: AC
  Administered 2020-05-15: 2 g via INTRAVENOUS

## 2020-05-15 MED ORDER — MORPHINE SULFATE (PF) 0.5 MG/ML IJ SOLN
INTRAMUSCULAR | Status: DC | PRN
  Administered 2020-05-15: .15 mg via INTRATHECAL

## 2020-05-15 MED ORDER — PROMETHAZINE HCL 25 MG/ML IJ SOLN: 6.2500 mg | INTRAMUSCULAR | Status: DC | PRN

## 2020-05-15 MED ORDER — MENTHOL 3 MG MT LOZG: 1.0000 | LOZENGE | OROMUCOSAL | Status: DC | PRN

## 2020-05-15 MED ORDER — NALOXONE HCL 0.4 MG/ML IJ SOLN: 0.4000 mg | INTRAMUSCULAR | Status: DC | PRN

## 2020-05-15 MED ORDER — OXYCODONE HCL 5 MG PO TABS: 5.0000 mg | ORAL_TABLET | Freq: Once | ORAL | Status: DC | PRN

## 2020-05-15 MED ORDER — PRENATAL MULTIVITAMIN CH
1.0000 | ORAL_TABLET | Freq: Every day | ORAL | Status: DC
  Administered 2020-05-16 – 2020-05-17 (×2): 1 via ORAL
  Filled 2020-05-15 (×3): qty 1

## 2020-05-15 MED ORDER — KETOROLAC TROMETHAMINE 30 MG/ML IJ SOLN
30.0000 mg | Freq: Once | INTRAMUSCULAR | Status: AC | PRN
  Administered 2020-05-15: 30 mg via INTRAVENOUS

## 2020-05-15 MED ORDER — SIMETHICONE 80 MG PO CHEW
80.0000 mg | CHEWABLE_TABLET | Freq: Three times a day (TID) | ORAL | Status: DC
  Administered 2020-05-15 – 2020-05-17 (×7): 80 mg via ORAL
  Filled 2020-05-15 (×7): qty 1

## 2020-05-15 MED ORDER — NALOXONE HCL 4 MG/10ML IJ SOLN
1.0000 ug/kg/h | INTRAVENOUS | Status: DC | PRN
  Filled 2020-05-15: qty 5

## 2020-05-15 MED ORDER — PHENYLEPHRINE HCL-NACL 20-0.9 MG/250ML-% IV SOLN
INTRAVENOUS | Status: AC
  Filled 2020-05-15: qty 250

## 2020-05-15 MED ORDER — SIMETHICONE 80 MG PO CHEW
80.0000 mg | CHEWABLE_TABLET | ORAL | Status: DC | PRN
Start: 2020-05-15 — End: 2020-05-18

## 2020-05-15 MED ORDER — ONDANSETRON HCL 4 MG/2ML IJ SOLN: 4.0000 mg | Freq: Three times a day (TID) | INTRAMUSCULAR | Status: DC | PRN

## 2020-05-15 MED ORDER — HYDROMORPHONE HCL 1 MG/ML IJ SOLN: 0.2500 mg | INTRAMUSCULAR | Status: DC | PRN

## 2020-05-15 MED ORDER — SOD CITRATE-CITRIC ACID 500-334 MG/5ML PO SOLN
30.0000 mL | ORAL | Status: AC
  Administered 2020-05-15: 30 mL via ORAL

## 2020-05-15 MED ORDER — DIPHENHYDRAMINE HCL 50 MG/ML IJ SOLN: 12.5000 mg | INTRAMUSCULAR | Status: DC | PRN

## 2020-05-15 MED ORDER — SENNOSIDES-DOCUSATE SODIUM 8.6-50 MG PO TABS
2.0000 | ORAL_TABLET | Freq: Every day | ORAL | Status: DC
  Administered 2020-05-16 – 2020-05-17 (×2): 2 via ORAL
  Filled 2020-05-15 (×2): qty 2

## 2020-05-15 MED ORDER — OXYTOCIN-SODIUM CHLORIDE 30-0.9 UT/500ML-% IV SOLN
INTRAVENOUS | Status: DC | PRN
  Administered 2020-05-15: 300 mL via INTRAVENOUS

## 2020-05-15 SURGICAL SUPPLY — 34 items
BARRIER ADHS 3X4 INTERCEED (GAUZE/BANDAGES/DRESSINGS) ×2 IMPLANT
BENZOIN TINCTURE PRP APPL 2/3 (GAUZE/BANDAGES/DRESSINGS) ×2 IMPLANT
CHLORAPREP W/TINT 26ML (MISCELLANEOUS) ×2 IMPLANT
CLAMP CORD UMBIL (MISCELLANEOUS) IMPLANT
CLOSURE STERI STRIP 1/2 X4 (GAUZE/BANDAGES/DRESSINGS) ×2 IMPLANT
CLOTH BEACON ORANGE TIMEOUT ST (SAFETY) ×2 IMPLANT
DRSG OPSITE POSTOP 4X10 (GAUZE/BANDAGES/DRESSINGS) ×2 IMPLANT
ELECT REM PT RETURN 9FT ADLT (ELECTROSURGICAL) ×2
ELECTRODE REM PT RTRN 9FT ADLT (ELECTROSURGICAL) ×1 IMPLANT
EXTRACTOR VACUUM KIWI (MISCELLANEOUS) IMPLANT
GLOVE BIOGEL M 7.0 STRL (GLOVE) ×4 IMPLANT
GLOVE BIOGEL PI IND STRL 7.0 (GLOVE) ×3 IMPLANT
GLOVE BIOGEL PI INDICATOR 7.0 (GLOVE) ×3
GOWN STRL REUS W/TWL LRG LVL3 (GOWN DISPOSABLE) ×6 IMPLANT
KIT ABG SYR 3ML LUER SLIP (SYRINGE) IMPLANT
NEEDLE HYPO 25X5/8 SAFETYGLIDE (NEEDLE) IMPLANT
NS IRRIG 1000ML POUR BTL (IV SOLUTION) ×2 IMPLANT
PACK C SECTION WH (CUSTOM PROCEDURE TRAY) ×2 IMPLANT
PAD OB MATERNITY 4.3X12.25 (PERSONAL CARE ITEMS) ×2 IMPLANT
PENCIL SMOKE EVAC W/HOLSTER (ELECTROSURGICAL) ×2 IMPLANT
RTRCTR C-SECT PINK 25CM LRG (MISCELLANEOUS) IMPLANT
STRIP CLOSURE SKIN 1/2X4 (GAUZE/BANDAGES/DRESSINGS) ×2 IMPLANT
SUT MNCRL 0 VIOLET CTX 36 (SUTURE) ×4 IMPLANT
SUT MONOCRYL 0 CTX 36 (SUTURE) ×8
SUT PDS AB 0 CT1 27 (SUTURE) ×4 IMPLANT
SUT PLAIN 0 NONE (SUTURE) IMPLANT
SUT VIC AB 2-0 CT1 27 (SUTURE) ×4
SUT VIC AB 2-0 CT1 TAPERPNT 27 (SUTURE) ×2 IMPLANT
SUT VIC AB 3-0 SH 27 (SUTURE)
SUT VIC AB 3-0 SH 27X BRD (SUTURE) IMPLANT
SUT VIC AB 4-0 KS 27 (SUTURE) ×2 IMPLANT
TOWEL OR 17X24 6PK STRL BLUE (TOWEL DISPOSABLE) ×2 IMPLANT
TRAY FOLEY W/BAG SLVR 14FR LF (SET/KITS/TRAYS/PACK) ×2 IMPLANT
WATER STERILE IRR 1000ML POUR (IV SOLUTION) ×2 IMPLANT

## 2020-05-15 NOTE — Transfer of Care (Signed)
Immediate Anesthesia Transfer of Care Note  Patient: Rebecca Pacheco  Procedure(s) Performed: CESAREAN SECTION. REQUESTING [facility] TO ASSIST. (N/A )  Patient Location: PACU  Anesthesia Type:Spinal  Level of Consciousness: awake  Airway & Oxygen Therapy: Patient Spontanous Breathing  Post-op Assessment: Report given to RN  Post vital signs: Reviewed and stable  Last Vitals:  Vitals Value Taken Time  BP 108/64 05/15/20 1145  Temp 36.3 C 05/15/20 1145  Pulse 66 05/15/20 1158  Resp 20 05/15/20 1158  SpO2 99 % 05/15/20 1158  Vitals shown include unvalidated device data.  Last Pain:  Vitals:   05/15/20 1145  TempSrc: Axillary  PainSc: 5          Complications: No complications documented.

## 2020-05-15 NOTE — Anesthesia Procedure Notes (Signed)
Spinal  Patient location during procedure: OR Start time: 05/15/2020 9:18 AM End time: 05/15/2020 9:21 AM Staffing Performed: anesthesiologist  Anesthesiologist: Lannie Fields, DO Preanesthetic Checklist Completed: patient identified, IV checked, risks and benefits discussed, surgical consent, monitors and equipment checked, pre-op evaluation and timeout performed Spinal Block Patient position: sitting Prep: DuraPrep and site prepped and draped Patient monitoring: cardiac monitor, continuous pulse ox and blood pressure Approach: midline Location: L3-4 Injection technique: single-shot Needle Needle type: Pencan  Needle gauge: 24 G Needle length: 9 cm Assessment Sensory level: T6 Additional Notes Functioning IV was confirmed and monitors were applied. Sterile prep and drape, including hand hygiene and sterile gloves were used. The patient was positioned and the spine was prepped. The skin was anesthetized with lidocaine.  Free flow of clear CSF was obtained prior to injecting local anesthetic into the CSF.  The spinal needle aspirated freely following injection.  The needle was carefully withdrawn.  The patient tolerated the procedure well.

## 2020-05-15 NOTE — H&P (Signed)
Date of Initial H&P:05/12/2020 History reviewed, patient examined, no change in status, stable for surgery. 

## 2020-05-15 NOTE — Op Note (Signed)
Cesarean Section Procedure Note  Indications: malpresentation: Breech  Pre-operative Diagnosis: 39 week 0 day pregnancy.  Post-operative Diagnosis: same  Surgeon: Gerald Leitz M.D.  Assistants: CNM Rhea Pink assisted due to complexity of the anatomy   Anesthesia: Spinal anesthesia  ASA Class: 2   Procedure Details   The patient was seen in the Holding Room. The risks, benefits, complications, treatment options, and expected outcomes were discussed with the patient.  The patient concurred with the proposed plan, giving informed consent.  The site of surgery properly noted/marked. The patient was taken to Operating Room # B, identified as Rebecca Pacheco and the procedure verified as C-Section Delivery. A Time Out was held and the above information confirmed.  After induction of anesthesia, the patient was draped and prepped in the usual sterile manner. A Pfannenstiel incision was made and carried down through the subcutaneous tissue to the fascia. Fascial incision was made and extended transversely. The fascia was separated from the underlying rectus tissue superiorly and inferiorly. The peritoneum was identified and entered. Peritoneal incision was extended longitudinally. The utero-vesical peritoneal reflection was incised transversely and the bladder flap was bluntly freed from the lower uterine segment. A low transverse uterine incision was made. Delivered from breech Rebecca Pacheco  presentation was a  Female with Apgar scores of 9 at one minute and 9 at five minutes. After the umbilical cord was clamped and cut cord blood was obtained for evaluation. The placenta was removed intact and appeared normal. The uterine outline, tubes and ovaries appeared normal. The uterine incision was closed with running locked sutures of 0 Monocryl.  A second layer of 0 monocryl was used to imbricate the incision. Hemostasis was observed. Lavage was carried out until clear. The peritoneum was reapproximated with 2-0  vicryl.  The fascia was then reapproximated with running sutures of 0 pds. The subcutaneous layer was re-approximated with 2-0 vicryl. The skin was reapproximated with 4-0 vicryl.  Instrument, sponge, and needle counts were correct prior the abdominal closure and at the conclusion of the case.   Findings: Female infant in the breech presentation. Normal appearing fallopian tubes and ovaries   Estimated Blood Loss:  362 mL         Drains: None         Total IV Fluids:  Per anesthesia ml         Specimens: Placenta sent to labor and delivery           Implants: None         Complications:  None; patient tolerated the procedure well.         Disposition: PACU - hemodynamically stable.         Condition: stable  Attending Attestation: I performed the procedure.

## 2020-05-15 NOTE — Lactation Note (Addendum)
This note was copied from a baby's chart. Lactation Consultation Note  Patient Name: Rebecca Pacheco OHYWV'P Date: 05/15/2020 Reason for consult: Initial assessment;Primapara;1st time breastfeeding;Term Age:39 hours  G1P1 mother seen today for initial lactation consult. Baby Rebecca is 6 hours old. Mother informed that she noticed breast changes during pregnancy. She delivered via c/s and stated that she and FOB were able to do STS for a combined 1+ hour in OR and recovery room. Mother expressed that she wants to provided BM. Informed that infant was bale to latch and nurse for ~ 2 minutes after delivery and colostrum noted.   Mother was taught hand expression by [employer] student. Colostrum noted on R and glisten noted on L breast. Mother was able to demonstrate back.   Mother informs she does have a Spectra 1 pump at home. Plans to return to work in 3 weeks, working from home. Mother wishes to start pumping at some point for storage and FOB to bottle feed along with putting infant to breast.   [facility] reviewed expected infant behaviors in first 24 hours, infant feeding cues, infant stomach size, input/output, and hand expression. Parents are aware of additional lactation services, support group, and warm line. Parents expressed that all questions and concerns were answered at this time.    Plan: - Continue to provide lots of STS  - Continue to feed infant on demand, follow infant feeding cues, at least 8-12x in a 24 hours period.  - Continue hand expression prior to feedings  Maternal Data Has patient been taught Hand Expression?: Yes Does the patient have breastfeeding experience prior to this delivery?: No  Feeding Mother's Current Feeding Choice: Breast Milk Interventions Interventions: Breast feeding basics reviewed;Skin to skin;Hand express;Education  Discharge Pump: Personal (At home) [facility] Program: No (Spectra DEBP)  Consult Status Consult Status:  Follow-up Date: 05/16/20 Follow-up type: In-patient    Zola Button 05/15/2020, 4:45 PM

## 2020-05-15 NOTE — Anesthesia Postprocedure Evaluation (Signed)
Anesthesia Post Note  Patient: Rebecca Pacheco  Procedure(s) Performed: CESAREAN SECTION. REQUESTING [facility] TO ASSIST. (N/A )     Patient location during evaluation: PACU Anesthesia Type: Spinal Level of consciousness: awake and alert and oriented Pain management: pain level controlled Vital Signs Assessment: post-procedure vital signs reviewed and stable Respiratory status: spontaneous breathing, nonlabored ventilation and respiratory function stable Cardiovascular status: blood pressure returned to baseline and stable Postop Assessment: no headache, no backache, spinal receding and patient able to bend at knees Anesthetic complications: no   No complications documented.  Last Vitals:  Vitals:   05/15/20 1140 05/15/20 1145  BP:  108/64  Pulse: (!) 58 62  Resp: 14 15  Temp:  (!) 36.3 C  SpO2: 99% 98%    Last Pain:  Vitals:   05/15/20 1145  TempSrc: Axillary  PainSc: 5    Pain Goal:            L Sensory Level: L2-Upper inner thigh, upper buttock (05/15/20 1145) R Sensory Level: L2-Upper inner thigh, upper buttock (05/15/20 1145) Epidural/Spinal Function Cutaneous sensation: Able to Wiggle Toes (05/15/20 1145), Patient able to flex knees: Yes (05/15/20 1145), Patient able to lift hips off bed: No (05/15/20 1145), Back pain beyond tenderness at insertion site: No (05/15/20 1145), Progressively worsening motor and/or sensory loss: No (05/15/20 1145), Bowel and/or bladder incontinence post epidural: No (05/15/20 1145)  Lannie Fields

## 2020-05-16 LAB — CBC
HCT: 28.2 % — ABNORMAL LOW (ref 36.0–46.0)
Hemoglobin: 9.2 g/dL — ABNORMAL LOW (ref 12.0–15.0)
MCH: 30 pg (ref 26.0–34.0)
MCHC: 32.6 g/dL (ref 30.0–36.0)
MCV: 91.9 fL (ref 80.0–100.0)
Platelets: 182 10*3/uL (ref 150–400)
RBC: 3.07 MIL/uL — ABNORMAL LOW (ref 3.87–5.11)
RDW: 13.5 % (ref 11.5–15.5)
WBC: 11.5 10*3/uL — ABNORMAL HIGH (ref 4.0–10.5)
nRBC: 0 % (ref 0.0–0.2)

## 2020-05-16 MED ORDER — ACETAMINOPHEN 325 MG PO TABS
650.0000 mg | ORAL_TABLET | Freq: Four times a day (QID) | ORAL | Status: DC | PRN
  Administered 2020-05-16 – 2020-05-18 (×5): 650 mg via ORAL
  Filled 2020-05-16 (×5): qty 2

## 2020-05-16 MED ORDER — OXYCODONE HCL 5 MG PO TABS: 5.0000 mg | ORAL_TABLET | ORAL | Status: DC | PRN

## 2020-05-16 NOTE — Lactation Note (Addendum)
This note was copied from a baby's chart. Lactation Consultation Note  Patient Name: Girl Nika Yazzie CNOBS'J Date: 05/16/2020 Reason for consult: Follow-up assessment;Mother's request;Difficult latch;1st time breastfeeding;Term Age:39 hours LC entered the room, dad was changing a soiled diaper, per dad, infant had 10 stools and 3 voids since birth. Per mom, infant has not been latching well falls asleep after 2-3 minutes at the breast.  Mom has flat nipples, LC used 20 mm NS and pre-filled with 57ml of mom's EBM using a curve tip syringe, mom latched infant on her right breast using the football hold position. Infant sustained latch and breastfeed for 20 minutes afterwards dad supplemented infant with 7 mls of donor breast milk with slow flow bottle nipple. Mom applied coconut oil to 24 mm breast flange and was using the DEBP, mom understands to pump every 3 hours for 15 minutes on initial setting. After pump mom will wipe coconut oil off and use comfort gels wearing a bra to help sooth sore nipples with mild abrasions. Mom will continue to latch infant according to cues, 8 to 12 or more times within 24 hours, STS. Infant has her own donor breast milk and will be supplemented after each feeding according to BF supplemental guidelines based on infant's age and hours of life.  Mom knows to ask RN or LC for assistance with latch if needed.  Maternal Data    Feeding Mother's Current Feeding Choice: Breast Milk and Donor Milk  LATCH Score Latch: Grasps breast easily, tongue down, lips flanged, rhythmical sucking.  Audible Swallowing: Spontaneous and intermittent  Type of Nipple: Flat  Comfort (Breast/Nipple): Filling, red/small blisters or bruises, mild/mod discomfort  Hold (Positioning): Assistance needed to correctly position infant at breast and maintain latch.  LATCH Score: 7   Lactation Tools Discussed/Used Tools: Pump Nipple shield size: 20 Breast pump type:  Double-Electric Breast Pump Reason for Pumping: Help establish milk supply, infant been poor latcher and sleepy. Pumping frequency: Mom will start pumping every 3 hours for 15 minutes on inital setting, mom put coocnut oil in breast flange to help with soreness  Interventions Interventions: Assisted with latch;Skin to skin;Hand express;Breast compression;Adjust position;Support pillows;Position options;Expressed milk;Coconut oil;Comfort gels;DEBP;Education  Discharge Pump: DEBP;Personal  Consult Status Consult Status: Follow-up Date: 05/17/20 Follow-up type: In-patient    Danelle Earthly 05/16/2020, 9:06 PM

## 2020-05-16 NOTE — Progress Notes (Signed)
Subjective: Postpartum Day 1: Cesarean Delivery Patient reports nausea, vomiting, incisional pain, tolerating PO and no problems voiding.    Objective: Vital signs in last 24 hours: Temp:  [97.6 F (36.4 C)-98.4 F (36.9 C)] 97.9 F (36.6 C) (02/20 0548) Pulse Rate:  [56-71] 69 (02/20 0548) Resp:  [18-20] 18 (02/20 0548) BP: (99-108)/(59) 101/59 (02/20 0548) SpO2:  [97 %-100 %] 100 % (02/20 0548)  Physical Exam:  General: alert, cooperative and no distress Lochia: appropriate Uterine Fundus: firm Incision: healing well DVT Evaluation: No evidence of DVT seen on physical exam.  Recent Labs    05/16/20 0522  HGB 9.2*  HCT 28.2*    Assessment/Plan: Status post Cesarean section. Doing well postoperatively.  Continue current care Pain control - continue ibuprofen scheduled.. d/c percocet changed to acetominophen 650 mg every 6 hours prn and oxycodone 5 mg every 4 hours prn.  Encourage ambulation .  Gerald Leitz 05/16/2020, 1:41 PM

## 2020-05-16 NOTE — Lactation Note (Addendum)
This note was copied from a baby's chart. Lactation Consultation Note  Patient Name: Rebecca Pacheco [id number] Date: 05/16/2020 Reason for consult: Follow-up assessment Age:39 hours  P1, Baby having difficulty sustaining latch.  Baby latches and becomes sleepy. Provided mother with #20NS and attempted in laid back position.   Baby had off and on a few sucks. Mother knows to pump q  3 hours and will consider donor milk if baby continues sleepy latch.   Lactation Tools Discussed/Used Tools: Nipple Dorris Carnes;Pump Nipple shield size: 20 Breast pump type: Double-Electric Breast Pump Reason for Pumping:  (Difficulty latching)  Interventions Interventions: Breast feeding basics reviewed;Assisted with latch;Skin to skin;Hand express;Adjust position    Consult Status Consult Status: Follow-up Date: 05/17/20 Follow-up type: In-patient    [name] [facility] 05/16/2020, 2:54 PM

## 2020-05-17 LAB — BIRTH TISSUE RECOVERY COLLECTION (PLACENTA DONATION)

## 2020-05-17 MED ORDER — OXYCODONE HCL 5 MG PO TABS: 5.0000 mg | ORAL_TABLET | ORAL | 0 refills | Status: DC | PRN

## 2020-05-17 MED ORDER — ACETAMINOPHEN 325 MG PO TABS: 650.0000 mg | ORAL_TABLET | Freq: Four times a day (QID) | ORAL | Status: DC | PRN

## 2020-05-17 MED ORDER — IBUPROFEN 800 MG PO TABS: 800.0000 mg | ORAL_TABLET | Freq: Three times a day (TID) | ORAL | 1 refills | Status: DC | PRN

## 2020-05-17 NOTE — Progress Notes (Signed)
Subjective: Postpartum Day 2: Cesarean Delivery Patient reports incisional pain, tolerating PO and no problems voiding.    Objective: Vital signs in last 24 hours: Temp:  [97.9 F (36.6 C)-98.8 F (37.1 C)] 98.8 F (37.1 C) (02/21 1339) Pulse Rate:  [65-87] 79 (02/21 1339) Resp:  [16-20] 17 (02/21 1339) BP: (100-118)/(52-69) 114/69 (02/21 1339) SpO2:  [98 %-100 %] 98 % (02/21 1339)  Physical Exam:  General: alert, cooperative and no distress Lochia: appropriate Uterine Fundus: firm Incision: healing well DVT Evaluation: No evidence of DVT seen on physical exam.  Recent Labs    05/16/20 0522  HGB 9.2*  HCT 28.2*    Assessment/Plan: Status post Cesarean section. Doing well postoperatively.  Continue current care Routine postpartum care Encourage ambulation D/c home tomorrow .  Gerald Leitz 05/17/2020, 2:56 PM

## 2020-05-17 NOTE — Lactation Note (Signed)
This note was copied from a baby's chart. Lactation Consultation Note  Patient Name: Rebecca Pacheco [id number] Date: 05/17/2020 Reason for consult: Follow-up assessment;1st time breastfeeding Age:39 hours  Suggest mother call for LC to view next feeding and next pumping session. Mother complaining of sore nipples. Wearing comfort gels.  Feeding Nipple Type: Slow - flow  Interventions Interventions: Education;DEBP;Comfort gels;Expressed milk  Discharge Pump: Personal  Consult Status Consult Status: Follow-up Date: 05/17/20 Follow-up type: In-patient   [name] [facility] 05/17/2020, 9:09 AM

## 2020-05-18 MED ORDER — HYDROCORTISONE 1 % EX CREA
TOPICAL_CREAM | Freq: Three times a day (TID) | CUTANEOUS | Status: DC
  Filled 2020-05-18: qty 28

## 2020-05-18 MED ORDER — HYDROCORTISONE 1 % EX CREA: TOPICAL_CREAM | Freq: Three times a day (TID) | CUTANEOUS | 0 refills | Status: DC

## 2020-05-18 NOTE — Lactation Note (Signed)
This note was copied from a baby's chart. Lactation Consultation Note  Patient Name: Rebecca Pacheco [id number] Date: 05/18/2020 Reason for consult: Follow-up assessment Age:39 hours  Mother paged to have latched checked. Mother using a #20 NS. Mother placed nipple shield on the left breast. She prefilled the shield with 3-4 ml of DBM and then placed infant on in cradle hold. Infant had a very poor latch . Lips were pursed and poor suckling observed.  Suggested to mother that she may have better results if she use cross cradle hold. Mother ask how to do cross cradle hold. Assist by placing her hands on the back of the infant shoulders and one hand under mothers breast . Infant latched on with wider lips  Observed infant with a few sucks and a few swallows while infant was taking in the milk that was placed in the shield.  Infant placed on alternate breast with #20 NS in football hold. Infants lips wide and flanged much better. Mother taking in the difference. .  Infant was fed 7 ml with a curved tip syringe.  Mother fed infant remainder of the bottle . Total feeding was 31 ml.  Discussed paced bottle feeding with a wide based bottle nipple.  Parents receptive to teaching.  Suggested that follow up with LC  was important when using a NS. Offered to forward a note to [facility] to get a call to schedule an appt. Mother agreeable.   Plan of Care : Breastfeed infant with feeding cues Supplement infant with ebm/formula, according to supplemental guidelines. Pump using a DEBP after each feeding for 15-20 mins.   Mother to continue to cue base feed infant and feed at least 8-12 times or more in 24 hours and advised to allow for cluster feeding infant as needed.  Mother to continue to due STS. Mother is aware of available LC services at [facility], BFSG'S, OP Dept, and phone # for questions or concerns about breastfeeding.  Mother receptive to all teaching and plan of care.    Maternal Data     Feeding Mother's Current Feeding Choice: Breast Milk  LATCH Score Latch: Grasps breast easily, tongue down, lips flanged, rhythmical sucking.  Audible Swallowing: Spontaneous and intermittent  Type of Nipple: Flat  Comfort (Breast/Nipple): Filling, red/small blisters or bruises, mild/mod discomfort  Hold (Positioning): Assistance needed to correctly position infant at breast and maintain latch.  LATCH Score: 7   Lactation Tools Discussed/Used Tools: Nipple Shields Nipple shield size: 20  Interventions Interventions: Assisted with latch;Skin to skin;Hand express;Breast compression;Adjust position;Support pillows;Position options;Expressed milk;Education;DEBP  Discharge Discharge Education: Engorgement and breast care;Warning signs for feeding baby;Outpatient recommendation  Consult Status Consult Status: Complete    Rebecca Pacheco 05/18/2020, 11:08 AM

## 2020-05-18 NOTE — Discharge Summary (Signed)
Postpartum Discharge Summary  Date of Service updated 05/18/2020    Patient Name: Rebecca Pacheco DOB: 1980/05/09 MRN: [id number]  Date of admission: 05/15/2020 Delivery date:05/15/2020  Delivering provider: Christophe Louis  Date of discharge: 05/18/2020  Admitting diagnosis: Breech presentation [O32.1XX0] Intrauterine pregnancy: [redacted]w[redacted]d    Secondary diagnosis:  Active Problems:   Breech presentation  Additional problems: None    Discharge diagnosis: Term Pregnancy Delivered                                              Post partum procedures:None Augmentation: N/A Complications: None  Hospital course: Sceduled C/S   40y.o. yo G1P1001 at 374w0das admitted to the hospital 05/15/2020 for scheduled cesarean section with the following indication:Malpresentation.Delivery details are as follows:  Membrane Rupture Time/Date: 9:46 AM ,05/15/2020   Delivery Method:C-Section, Low Transverse  Details of operation can be found in separate operative note.  Patient had an uncomplicated postpartum course.  She is ambulating, tolerating a regular diet, passing flatus, and urinating well. Patient is discharged home in stable condition on  05/18/20        Newborn Data: Birth date:05/15/2020  Birth time:9:46 AM  Gender:Female  Living status:Living  Apgars:9 ,9  Weight:2970 g     Magnesium Sulfate received: No BMZ received: No Rhophylac:N/A MMR:N/A T-DaP:Given prenatally Flu: N/A Transfusion:No  Physical exam  Vitals:   05/17/20 0540 05/17/20 1339 05/17/20 2100 05/18/20 0539  BP: 118/67 114/69 122/71 111/71  Pulse: 65 79 74 83  Resp: 18 17 18 18   Temp: 97.9 F (36.6 C) 98.8 F (37.1 C) 97.7 F (36.5 C) 97.9 F (36.6 C)  TempSrc: Oral Oral Oral Axillary  SpO2: 100% 98% 100% 99%  Weight:      Height:       General: alert, cooperative and no distress Lochia: appropriate Uterine Fundus: firm Incision: Healing well with no significant drainage DVT Evaluation: No evidence of DVT  seen on physical exam. Labs: Lab Results  Component Value Date   WBC 11.5 (H) 05/16/2020   HGB 9.2 (L) 05/16/2020   HCT 28.2 (L) 05/16/2020   MCV 91.9 05/16/2020   PLT 182 05/16/2020   CMP Latest Ref Rng & Units 09/21/2017  Glucose 65 - 99 mg/dL 88  BUN 7 - 25 mg/dL 9  Creatinine 0.50 - 1.10 mg/dL 0.62  Sodium 135 - 146 mmol/L 138  Potassium 3.5 - 5.3 mmol/L 4.2  Chloride 98 - 110 mmol/L 103  CO2 20 - 32 mmol/L 21  Calcium 8.6 - 10.2 mg/dL 9.7  Total Protein 6.1 - 8.1 g/dL 7.2  Total Bilirubin 0.2 - 1.2 mg/dL 0.3  AST 10 - 30 U/L 17  ALT 6 - 29 U/L 16   Edinburgh Score: Edinburgh Postnatal Depression Scale Screening Tool 05/16/2020  I have been able to laugh and see the funny side of things. 0  I have looked forward with enjoyment to things. 0  I have blamed myself unnecessarily when things went wrong. 1  I have been anxious or worried for no good reason. 2  I have felt scared or panicky for no good reason. 0  Things have been getting on top of me. 1  I have been so unhappy that I have had difficulty sleeping. 1  I have felt sad or miserable. 1  I have been so unhappy that  I have been crying. 1  The thought of harming myself has occurred to me. 0  Edinburgh Postnatal Depression Scale Total 7      After visit meds:  Allergies as of 05/18/2020      Reactions   Poison Oak Extract Hives, Itching   Mango Flavor Rash   Rash around lips   Poison Ivy Extract    Proparacaine    hypotension      Medication List    TAKE these medications   acetaminophen 325 MG tablet Commonly known as: TYLENOL Take 2 tablets (650 mg total) by mouth every 6 (six) hours as needed for moderate pain or mild pain.   COLLAGEN PO Take 2 capsules by mouth daily.   [employer] FIBER GUMMIES PO Take 2 each by mouth daily.   hydrocortisone cream 1 % Apply topically 3 (three) times daily.   ibuprofen 800 MG tablet Commonly known as: ADVIL Take 1 tablet (800 mg total) by mouth every 8 (eight) hours  as needed.   OVER THE COUNTER MEDICATION Take 2 capsules by mouth daily. Algae   oxyCODONE 5 MG immediate release tablet Commonly known as: Oxy IR/ROXICODONE Take 1 tablet (5 mg total) by mouth every 4 (four) hours as needed for severe pain or breakthrough pain.   prenatal multivitamin Tabs tablet Take 1 tablet by mouth daily at 12 noon.        Discharge home in stable condition Infant Feeding: Breast Infant Disposition:home with mother Discharge instruction: per After Visit Summary and Postpartum booklet. Activity: Advance as tolerated. Pelvic rest for 6 weeks.  Diet: routine diet Anticipated Birth Control: Unsure Postpartum Appointment:2 weeks Additional Postpartum F/U:  incision check in 2 weeks  Future Appointments:No future appointments. Follow up Visit:  Follow-up Information    Christophe Louis, MD. Go in 2 week(s).   Specialty: Obstetrics and Gynecology Why: please call to make an appt in 2 weeks for incision check if you do not already have an appointment scheduled.  Contact information: [address] [place] [address] [phone]                   05/18/2020 Christophe Louis, MD

## 2020-05-18 NOTE — Lactation Note (Signed)
This note was copied from a baby's chart. Lactation Consultation Note  Patient Name: Rebecca Pacheco [id number] Date: 05/18/2020   Age:39 hours   P1 mother whose infant is now 76 hours old.  This is a term baby at 39+0 weeks with an 8% weight loss this morning.    Mother has been breast feeding and supplementing with donor breast milk.  When I arrived mother was almost finished bottle feeding baby.  Baby consumed 5 mls of mother's EBM and 15 mls of donor breast milk.  Father stated that baby was continuing to be sleepy and was taking 45 minutes to finish her bottle.  Baby was showing more feeding cues and I suggested trying the cup feeder with her.  Parents were interested.  Father warmed another 15 mls of donor breast milk and I demonstrated the cup feeder.  Baby easily consumed the milk and burped after.  Placed her in the bassinet where she fell asleep.  Observed mother pumping with the DEBP.  Reviewed pump basics.  Flange size is appropriate and mother has a DEBP for home use. Mother was able to obtain 6 mls of EBM.  Worked with family to develop a feeding plan for after discharge.  Suggested mother call back for assistance prior to discharge if needed.  Discussed how to decrease the total amount of time spent feeding the baby to help minimize further weight loss.  Encouraged to feed baby 30 mls or more after breast feeding.  Mother will use the NS as needed.  Suggested she try to latch without the NS, however, she may use it if needed.    Family aware that they will need to supplement with formula until mother's milk supply is full volume.  Family has purchased formula. Father very helpful and supportive.    Engorgement prevention/treatment reviewed.  Demonstrated manual pump for mother. RN updated.   Maternal Data    Feeding Nipple Type: Slow - flow  LATCH Score                    Lactation Tools Discussed/Used    Interventions    Discharge    Consult  Status Consult Status: Complete Date: 05/18/20 Follow-up type: Call as needed    Beth R DelFava 05/18/2020, 7:04 AM

## 2021-10-05 ENCOUNTER — Emergency Department (HOSPITAL_BASED_OUTPATIENT_CLINIC_OR_DEPARTMENT_OTHER): Payer: 59 | Admitting: Radiology

## 2021-10-05 ENCOUNTER — Encounter (HOSPITAL_BASED_OUTPATIENT_CLINIC_OR_DEPARTMENT_OTHER): Payer: Self-pay

## 2021-10-05 ENCOUNTER — Emergency Department (HOSPITAL_BASED_OUTPATIENT_CLINIC_OR_DEPARTMENT_OTHER): Payer: 59

## 2021-10-05 ENCOUNTER — Other Ambulatory Visit: Payer: Self-pay

## 2021-10-05 ENCOUNTER — Emergency Department (HOSPITAL_BASED_OUTPATIENT_CLINIC_OR_DEPARTMENT_OTHER)
Admission: EM | Admit: 2021-10-05 | Discharge: 2021-10-06 | Disposition: A | Discharge status: Home / Self Care | Payer: 59 | Attending: Emergency Medicine | Admitting: Emergency Medicine

## 2021-10-05 DIAGNOSIS — R5383 Other fatigue: Secondary | ICD-10-CM | POA: Insufficient documentation

## 2021-10-05 DIAGNOSIS — Z20822 Contact with and (suspected) exposure to covid-19: Secondary | ICD-10-CM | POA: Insufficient documentation

## 2021-10-05 DIAGNOSIS — R079 Chest pain, unspecified: Secondary | ICD-10-CM | POA: Diagnosis present

## 2021-10-05 DIAGNOSIS — R0602 Shortness of breath: Secondary | ICD-10-CM | POA: Insufficient documentation

## 2021-10-05 DIAGNOSIS — R531 Weakness: Secondary | ICD-10-CM | POA: Diagnosis not present

## 2021-10-05 DIAGNOSIS — R0789 Other chest pain: Secondary | ICD-10-CM | POA: Diagnosis not present

## 2021-10-05 LAB — CBC
HCT: 37.9 % (ref 36.0–46.0)
Hemoglobin: 12.6 g/dL (ref 12.0–15.0)
MCH: 29.3 pg (ref 26.0–34.0)
MCHC: 33.2 g/dL (ref 30.0–36.0)
MCV: 88.1 fL (ref 80.0–100.0)
Platelets: 266 10*3/uL (ref 150–400)
RBC: 4.3 MIL/uL (ref 3.87–5.11)
RDW: 14.4 % (ref 11.5–15.5)
WBC: 11.3 10*3/uL — ABNORMAL HIGH (ref 4.0–10.5)
nRBC: 0 % (ref 0.0–0.2)

## 2021-10-05 LAB — BASIC METABOLIC PANEL
Anion gap: 14 (ref 5–15)
BUN: 5 mg/dL — ABNORMAL LOW (ref 6–20)
CO2: 22 mmol/L (ref 22–32)
Calcium: 9.5 mg/dL (ref 8.9–10.3)
Chloride: 102 mmol/L (ref 98–111)
Creatinine, Ser: 0.5 mg/dL (ref 0.44–1.00)
GFR, Estimated: >60 mL/min (ref >60)
Glucose, Bld: 97 mg/dL (ref 70–99)
Potassium: 3.5 mmol/L (ref 3.5–5.1)
Sodium: 138 mmol/L (ref 135–145)

## 2021-10-05 LAB — PREGNANCY, URINE: Preg Test, Ur: NEGATIVE

## 2021-10-05 LAB — TROPONIN I (HIGH SENSITIVITY): Troponin I (High Sensitivity): <2 ng/L (ref <18)

## 2021-10-05 LAB — SARS CORONAVIRUS 2 BY RT PCR: SARS Coronavirus 2 by RT PCR: NEGATIVE

## 2021-10-05 MED ORDER — IOHEXOL 350 MG/ML SOLN
75.0000 mL | Freq: Once | INTRAVENOUS | Status: AC | PRN
  Administered 2021-10-05: 75 mL via INTRAVENOUS

## 2021-10-05 NOTE — ED Triage Notes (Signed)
Pt reports she began experiencing a sudden onset of stabbing chest pain yesterday around 1900 that was associated with sob that lasted about 3 hours. States the symptoms are now intermittent. Pt reports a fever of 103 last night as well. Pt saw her PCP and was told she had an elevated D-dimer. Pt reports traveling back from [place] last week.

## 2021-10-05 NOTE — ED Provider Notes (Signed)
[facility] Provider Note: Lowella Dell, MD, FACEP  CSN: [id number] MRN: [id number] ARRIVAL: 10/05/21 at 1756 ROOM: DB016/DB016   CHIEF COMPLAINT  Chest Pain   HISTORY OF PRESENT ILLNESS  10/05/21 11:06 PM Rebecca Pacheco is a 41 y.o. female who began experiencing a sudden, stabbing chest pain yesterday about 7 PM that was associated with shortness of breath.  This lasted about 3 hours.  She has had intermittent symptoms subsequently.  She had a temperature as high as 103 yesterday evening as well.  She saw her PCP at [facility] today and was found to have a D-dimer of 1.29.  This was concerning as she recently traveled here from [place] by air (about a week ago) and she was sent here.  She has also had general weakness and fatigue for about the past 2 weeks.  She was noted to be tachycardic on arrival but this has resolved.   Past Medical History:  Diagnosis Date   Blood in stool    anal tear   Family history of adverse reaction to anesthesia    difficulty intubation   History of fainting spells of unknown cause    Psoriasis     Past Surgical History:  Procedure Laterality Date   CESAREAN SECTION N/A 05/15/2020   Procedure: CESAREAN SECTION. REQUESTING [facility] TO ASSIST.;  Surgeon: Gerald Leitz, MD;  [facility]: [facility];  Service: Obstetrics;  Laterality: N/A;   TONSILLECTOMY  1987    Family History  Problem Relation Age of Onset   Arthritis Mother    Asthma Mother    Hypertension Mother    Hyperlipidemia Father    Hearing loss Father    Cancer Brother    Hypertension Brother    Early death Maternal Grandmother    Early death Maternal Grandfather    Cancer Maternal Grandfather    Arthritis Paternal Grandmother    Arthritis Paternal Grandfather    Hearing loss Paternal Grandfather    Heart disease Paternal Grandfather    Hyperlipidemia Paternal Grandfather     Social History   Tobacco Use   Smoking status: Never   Smokeless tobacco: Never  Vaping Use    Vaping Use: Never used  Substance Use Topics   Alcohol use: Yes    Alcohol/week: 2.0 standard drinks of alcohol    Types: 2 Glasses of wine per week   Drug use: Never    Prior to Admission medications   Medication Sig Start Date End Date Taking? Authorizing Provider  acetaminophen (TYLENOL) 325 MG tablet Take 2 tablets (650 mg total) by mouth every 6 (six) hours as needed for moderate pain or mild pain. 05/17/20   Gerald Leitz, MD  COLLAGEN PO Take 2 capsules by mouth daily.    [provider]  [employer] FIBER GUMMIES PO Take 2 each by mouth daily.    [provider]  hydrocortisone cream 1 % Apply topically 3 (three) times daily. 05/18/20   Gerald Leitz, MD  ibuprofen (ADVIL) 800 MG tablet Take 1 tablet (800 mg total) by mouth every 8 (eight) hours as needed. 05/17/20   Gerald Leitz, MD  OVER THE COUNTER MEDICATION Take 2 capsules by mouth daily. Algae    [provider]  oxyCODONE (OXY IR/ROXICODONE) 5 MG immediate release tablet Take 1 tablet (5 mg total) by mouth every 4 (four) hours as needed for severe pain or breakthrough pain. 05/17/20   Gerald Leitz, MD  Prenatal Vit-Fe Fumarate-FA (PRENATAL MULTIVITAMIN) TABS tablet Take 1 tablet by  mouth daily at 12 noon.    [provider]    Allergies Poison oak extract, Mango flavor, Poison ivy extract, and Proparacaine   REVIEW OF SYSTEMS  Negative except as noted here or in the History of Present Illness.   PHYSICAL EXAMINATION  Initial Vital Signs Blood pressure 118/71, pulse 75, temperature 99.7 F (37.6 C), resp. rate 17, height 5\' 2"  (1.575 m), weight 68 kg, SpO2 100 %, unknown if currently breastfeeding.  Examination General: Well-developed, well-nourished female in no acute distress; appearance consistent with age of record HENT: normocephalic; atraumatic Eyes: pupils equal, round and reactive to light; extraocular muscles intact Neck: supple Heart: regular rate and rhythm Lungs: clear to auscultation  bilaterally Abdomen: soft; nondistended; nontender; bowel sounds present Extremities: No deformity; full range of motion; pulses normal Neurologic: Awake, alert and oriented; motor function intact in all extremities and symmetric; no facial droop Skin: Warm and dry Psychiatric: Normal mood and affect   RESULTS  Summary of this visit's results, reviewed and interpreted by myself:   EKG Interpretation  Date/Time:  Wednesday October 05 2021 18:04:04 EDT Ventricular Rate:  111 PR Interval:  116 QRS Duration: 74 QT Interval:  316 QTC Calculation: 429 R Axis:   90 Text Interpretation: Sinus tachycardia Rightward axis Marked ST abnormality, possible inferior subendocardial injury Abnormal ECG No previous ECGs available Confirmed by Charrise Lardner, Jonny Ruiz ([id number]) on 10/05/2021 11:05:55 PM       Laboratory Studies: Results for orders placed or performed during the hospital encounter of 10/05/21 (from the past 24 hour(s))  Basic metabolic panel     Status: Abnormal   Collection Time: 10/05/21  6:23 PM  Result Value Ref Range   Sodium 138 135 - 145 mmol/L   Potassium 3.5 3.5 - 5.1 mmol/L   Chloride 102 98 - 111 mmol/L   CO2 22 22 - 32 mmol/L   Glucose, Bld 97 70 - 99 mg/dL   BUN 5 (L) 6 - 20 mg/dL   Creatinine, Ser 6.20 0.44 - 1.00 mg/dL   Calcium 9.5 8.9 - 35.5 mg/dL   GFR, Estimated >97 >41 mL/min   Anion gap 14 5 - 15  CBC     Status: Abnormal   Collection Time: 10/05/21  6:23 PM  Result Value Ref Range   WBC 11.3 (H) 4.0 - 10.5 K/uL   RBC 4.30 3.87 - 5.11 MIL/uL   Hemoglobin 12.6 12.0 - 15.0 g/dL   HCT 63.8 45.3 - 64.6 %   MCV 88.1 80.0 - 100.0 fL   MCH 29.3 26.0 - 34.0 pg   MCHC 33.2 30.0 - 36.0 g/dL   RDW 80.3 21.2 - 24.8 %   Platelets 266 150 - 400 K/uL   nRBC 0.0 0.0 - 0.2 %  Troponin I (High Sensitivity)     Status: None   Collection Time: 10/05/21  6:23 PM  Result Value Ref Range   Troponin I (High Sensitivity) <2 <18 ng/L  SARS Coronavirus 2 by RT PCR (hospital order,  performed in [facility] hospital lab) *cepheid single result test* Anterior Nasal Swab     Status: None   Collection Time: 10/05/21  6:24 PM   Specimen: Anterior Nasal Swab  Result Value Ref Range   SARS Coronavirus 2 by RT PCR NEGATIVE NEGATIVE   Imaging Studies: DG Chest 2 View  Result Date: 10/05/2021 CLINICAL DATA:  Chest pain. EXAM: CHEST - 2 VIEW COMPARISON:  None Available. FINDINGS: Nodular density on or within the skin surface of  the left anterior chest wall measuring 17 mm. The heart size and mediastinal contours are within normal limits. Both lungs are clear. The visualized skeletal structures are unremarkable. IMPRESSION: 1.  No active cardiopulmonary disease. 2. Nodular density on or within the anterior left chest wall measuring 17 mm. Correlate clinically. Electronically Signed   By: Darliss Cheney M.D.   On: 10/05/2021 19:13    ED COURSE and MDM  Nursing notes, initial and subsequent vitals signs, including pulse oximetry, reviewed and interpreted by myself.  Vitals:   10/05/21 1807 10/05/21 1815 10/05/21 2300  BP: (!) 127/113  118/71  Pulse: (!) 107  75  Resp: 18  17  Temp: 99.7 F (37.6 C)    SpO2: 98%  100%  Weight:  68 kg   Height:  5\' 2"  (1.575 m)    Medications - No data to display    PROCEDURES  Procedures   ED DIAGNOSES  No diagnosis found.

## 2021-10-06 ENCOUNTER — Other Ambulatory Visit: Payer: Self-pay

## 2021-10-06 LAB — URINALYSIS, ROUTINE W REFLEX MICROSCOPIC
Bilirubin Urine: NEGATIVE
Glucose, UA: NEGATIVE mg/dL
Hgb urine dipstick: NEGATIVE
Ketones, ur: NEGATIVE mg/dL
Leukocytes,Ua: NEGATIVE
Nitrite: NEGATIVE
Protein, ur: NEGATIVE mg/dL
Specific Gravity, Urine: 1.006 (ref 1.005–1.030)
pH: 6 (ref 5.0–8.0)
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
## 2023-01-31 ENCOUNTER — Other Ambulatory Visit: Payer: Self-pay | Admitting: Adult Health Nurse Practitioner

## 2023-01-31 DIAGNOSIS — Z13220 Encounter for screening for lipoid disorders: Secondary | ICD-10-CM | POA: Diagnosis not present

## 2023-01-31 DIAGNOSIS — E559 Vitamin D deficiency, unspecified: Secondary | ICD-10-CM | POA: Diagnosis not present

## 2023-01-31 DIAGNOSIS — Z0000 Encounter for general adult medical examination without abnormal findings: Secondary | ICD-10-CM | POA: Diagnosis not present

## 2023-01-31 DIAGNOSIS — Z136 Encounter for screening for cardiovascular disorders: Secondary | ICD-10-CM | POA: Diagnosis not present

## 2023-01-31 DIAGNOSIS — Z1231 Encounter for screening mammogram for malignant neoplasm of breast: Secondary | ICD-10-CM | POA: Diagnosis not present

## 2023-01-31 DIAGNOSIS — Z124 Encounter for screening for malignant neoplasm of cervix: Secondary | ICD-10-CM | POA: Diagnosis not present

## 2023-02-02 ENCOUNTER — Ambulatory Visit: Admission: EM | Admit: 2023-02-02 | Discharge: 2023-02-02 | Discharge status: Against Medical Advice | Payer: 59

## 2023-02-02 DIAGNOSIS — S0501XA Injury of conjunctiva and corneal abrasion without foreign body, right eye, initial encounter: Secondary | ICD-10-CM | POA: Diagnosis not present

## 2023-02-05 DIAGNOSIS — S0501XA Injury of conjunctiva and corneal abrasion without foreign body, right eye, initial encounter: Secondary | ICD-10-CM | POA: Diagnosis not present

## 2023-03-16 DIAGNOSIS — N911 Secondary amenorrhea: Secondary | ICD-10-CM | POA: Diagnosis not present

## 2023-03-22 DIAGNOSIS — J0101 Acute recurrent maxillary sinusitis: Secondary | ICD-10-CM | POA: Diagnosis not present

## 2023-04-05 DIAGNOSIS — F4323 Adjustment disorder with mixed anxiety and depressed mood: Secondary | ICD-10-CM | POA: Diagnosis not present

## 2023-04-13 DIAGNOSIS — F4323 Adjustment disorder with mixed anxiety and depressed mood: Secondary | ICD-10-CM | POA: Diagnosis not present

## 2023-05-15 DIAGNOSIS — Z3169 Encounter for other general counseling and advice on procreation: Secondary | ICD-10-CM | POA: Diagnosis not present

## 2023-05-15 DIAGNOSIS — N970 Female infertility associated with anovulation: Secondary | ICD-10-CM | POA: Diagnosis not present

## 2023-07-04 DIAGNOSIS — Z3480 Encounter for supervision of other normal pregnancy, unspecified trimester: Secondary | ICD-10-CM | POA: Diagnosis not present

## 2023-07-07 ENCOUNTER — Inpatient Hospital Stay (HOSPITAL_COMMUNITY)

## 2023-07-07 ENCOUNTER — Inpatient Hospital Stay (HOSPITAL_COMMUNITY)
Admission: AD | Admit: 2023-07-07 | Discharge: 2023-07-07 | Disposition: A | Discharge status: Home / Self Care | Attending: Family Medicine | Admitting: Family Medicine

## 2023-07-07 ENCOUNTER — Encounter (HOSPITAL_COMMUNITY): Payer: Self-pay

## 2023-07-07 DIAGNOSIS — O418X10 Other specified disorders of amniotic fluid and membranes, first trimester, not applicable or unspecified: Secondary | ICD-10-CM

## 2023-07-07 DIAGNOSIS — Z3A01 Less than 8 weeks gestation of pregnancy: Secondary | ICD-10-CM | POA: Diagnosis not present

## 2023-07-07 DIAGNOSIS — O09511 Supervision of elderly primigravida, first trimester: Secondary | ICD-10-CM | POA: Diagnosis not present

## 2023-07-07 DIAGNOSIS — O200 Threatened abortion: Secondary | ICD-10-CM

## 2023-07-07 DIAGNOSIS — Z3A00 Weeks of gestation of pregnancy not specified: Secondary | ICD-10-CM | POA: Diagnosis not present

## 2023-07-07 DIAGNOSIS — O208 Other hemorrhage in early pregnancy: Secondary | ICD-10-CM | POA: Insufficient documentation

## 2023-07-07 DIAGNOSIS — O09521 Supervision of elderly multigravida, first trimester: Secondary | ICD-10-CM | POA: Diagnosis not present

## 2023-07-07 DIAGNOSIS — O209 Hemorrhage in early pregnancy, unspecified: Secondary | ICD-10-CM

## 2023-07-07 LAB — CBC WITH DIFFERENTIAL/PLATELET
Abs Immature Granulocytes: 0.01 10*3/uL (ref 0.00–0.07)
Basophils Absolute: 0 10*3/uL (ref 0.0–0.1)
Basophils Relative: 1 %
Eosinophils Absolute: 0.1 10*3/uL (ref 0.0–0.5)
Eosinophils Relative: 1 %
HCT: 35.8 % — ABNORMAL LOW (ref 36.0–46.0)
Hemoglobin: 12 g/dL (ref 12.0–15.0)
Immature Granulocytes: 0 %
Lymphocytes Relative: 29 %
Lymphs Abs: 2.3 10*3/uL (ref 0.7–4.0)
MCH: 30.5 pg (ref 26.0–34.0)
MCHC: 33.5 g/dL (ref 30.0–36.0)
MCV: 90.9 fL (ref 80.0–100.0)
Monocytes Absolute: 0.5 10*3/uL (ref 0.1–1.0)
Monocytes Relative: 6 %
Neutro Abs: 5.1 10*3/uL (ref 1.7–7.7)
Neutrophils Relative %: 63 %
Platelets: 265 10*3/uL (ref 150–400)
RBC: 3.94 MIL/uL (ref 3.87–5.11)
RDW: 13.4 % (ref 11.5–15.5)
WBC: 8 10*3/uL (ref 4.0–10.5)
nRBC: 0 % (ref 0.0–0.2)

## 2023-07-07 LAB — WET PREP, GENITAL
Clue Cells Wet Prep HPF POC: NONE SEEN
Sperm: NONE SEEN
Trich, Wet Prep: NONE SEEN
WBC, Wet Prep HPF POC: <10 (ref <10)
Yeast Wet Prep HPF POC: NONE SEEN

## 2023-07-07 LAB — COMPREHENSIVE METABOLIC PANEL WITH GFR
ALT: 19 U/L (ref 0–44)
AST: 18 U/L (ref 15–41)
Albumin: 3.9 g/dL (ref 3.5–5.0)
Alkaline Phosphatase: 46 U/L (ref 38–126)
Anion gap: 12 (ref 5–15)
BUN: 8 mg/dL (ref 6–20)
CO2: 24 mmol/L (ref 22–32)
Calcium: 9.7 mg/dL (ref 8.9–10.3)
Chloride: 102 mmol/L (ref 98–111)
Creatinine, Ser: 0.55 mg/dL (ref 0.44–1.00)
GFR, Estimated: >60 mL/min (ref >60)
Glucose, Bld: 99 mg/dL (ref 70–99)
Potassium: 4.1 mmol/L (ref 3.5–5.1)
Sodium: 138 mmol/L (ref 135–145)
Total Bilirubin: 0.5 mg/dL (ref 0.0–1.2)
Total Protein: 6.9 g/dL (ref 6.5–8.1)

## 2023-07-07 LAB — HCG, QUANTITATIVE, PREGNANCY: hCG, Beta Chain, Quant, S: 203 m[IU]/mL — ABNORMAL HIGH (ref <5)

## 2023-07-07 LAB — POCT PREGNANCY, URINE: Preg Test, Ur: NEGATIVE

## 2023-07-07 LAB — HIV ANTIBODY (ROUTINE TESTING W REFLEX): HIV Screen 4th Generation wRfx: NONREACTIVE

## 2023-07-07 NOTE — MAU Note (Signed)
..  Rebecca Pacheco is a 43 y.o. at Unknown here in MAU reporting: +HPT x3 weeks ago. States that she had irregular periods prior to getting pregnant. This morning she woke up and noted a small amount of bleeding and a few small clots. When she just used the restroom on arrival its more of a moderate amount of bright red blood on her pad. Also reports intermittent "lightning bolt" pains in her lower abdomen. Last IC was x4 days ago. [facility] Used Clomid to get pregnant. Had an HCG on 4/9 and it was 786.35 per labs in the office on patients portal.  LMP: 05/29/23 Onset of complaint: 0830 Pain score: 2 Vitals:   07/07/23 1011  BP: 121/67  Pulse: 79  Resp: 14  Temp: 98.8 F (37.1 C)  SpO2: 100%     FHT:n/a Lab orders placed from triage:   UPT

## 2023-07-07 NOTE — MAU Provider Note (Signed)
 History     CSN: [id number]  Arrival date and time: 07/07/23 0947   None     Chief Complaint  Patient presents with   Vaginal Bleeding   HPI  Rebecca Pacheco is a 43 y.o. Female G2P1001 @ [redacted]w[redacted]d here with bleeding. The bleeding started this morning around 0830. The bleeding is similar to a menstrual cycle. She reports mild lower abdominal cramping that comes and goes. The bleeding is bright red.   Natural conception after clomid use.  Was seen in Dr. Arleta Lade office on 4/9 and hcg was 700  OB History     Gravida  2   Para  1   Term  1   Preterm      AB  0   Living  1      SAB      IAB      Ectopic      Multiple  0   Live Births  1           Past Medical History:  Diagnosis Date   Blood in stool    anal tear   Family history of adverse reaction to anesthesia    difficulty intubation   History of fainting spells of unknown cause    Psoriasis     Past Surgical History:  Procedure Laterality Date   CESAREAN SECTION N/A 05/15/2020   Procedure: CESAREAN SECTION. REQUESTING [facility] TO ASSIST.;  Surgeon: Arlee Lace, MD;  [facility];  Service: Obstetrics;  Laterality: N/A;   TONSILLECTOMY  1987    Family History  Problem Relation Age of Onset   Arthritis Mother    Asthma Mother    Hypertension Mother    Hyperlipidemia Father    Hearing loss Father    Cancer Brother    Hypertension Brother    Early death Maternal Grandmother    Early death Maternal Grandfather    Cancer Maternal Grandfather    Arthritis Paternal Grandmother    Arthritis Paternal Grandfather    Hearing loss Paternal Grandfather    Heart disease Paternal Grandfather    Hyperlipidemia Paternal Grandfather     Social History   Tobacco Use   Smoking status: Never   Smokeless tobacco: Never  Vaping Use   Vaping status: Never Used  Substance Use Topics   Alcohol use: Yes    Alcohol/week: 2.0 standard drinks of alcohol    Types: 2 Glasses of wine per week   Drug  use: Never    Allergies:  Allergies  Allergen Reactions   Poison Oak Extract Hives and Itching   Mango Flavoring Agent (Non-Screening) Rash    Rash around lips   Poison Ivy Extract    Proparacaine     hypotension    Medications Prior to Admission  Medication Sig Dispense Refill Last Dose/Taking   COLLAGEN PO Take 2 capsules by mouth daily.      [employer] FIBER GUMMIES PO Take 2 each by mouth daily.      OVER THE COUNTER MEDICATION Take 2 capsules by mouth daily. Algae      Prenatal Vit-Fe Fumarate-FA (PRENATAL MULTIVITAMIN) TABS tablet Take 1 tablet by mouth daily at 12 noon.      Results for orders placed or performed during the hospital encounter of 07/07/23 (from the past 48 hours)  Pregnancy, urine POC     Status: None   Collection Time: 07/07/23 10:07 AM  Result Value Ref Range   Preg Test, Ur NEGATIVE NEGATIVE  Comment:        THE SENSITIVITY OF THIS METHODOLOGY IS >24 mIU/mL   CBC with Differential/Platelet     Status: Abnormal   Collection Time: 07/07/23 11:00 AM  Result Value Ref Range   WBC 8.0 4.0 - 10.5 K/uL   RBC 3.94 3.87 - 5.11 MIL/uL   Hemoglobin 12.0 12.0 - 15.0 g/dL   HCT 16.1 (L) 09.6 - 04.5 %   MCV 90.9 80.0 - 100.0 fL   MCH 30.5 26.0 - 34.0 pg   MCHC 33.5 30.0 - 36.0 g/dL   RDW 40.9 81.1 - 91.4 %   Platelets 265 150 - 400 K/uL   nRBC 0.0 0.0 - 0.2 %   Neutrophils Relative % 63 %   Neutro Abs 5.1 1.7 - 7.7 K/uL   Lymphocytes Relative 29 %   Lymphs Abs 2.3 0.7 - 4.0 K/uL   Monocytes Relative 6 %   Monocytes Absolute 0.5 0.1 - 1.0 K/uL   Eosinophils Relative 1 %   Eosinophils Absolute 0.1 0.0 - 0.5 K/uL   Basophils Relative 1 %   Basophils Absolute 0.0 0.0 - 0.1 K/uL   Immature Granulocytes 0 %   Abs Immature Granulocytes 0.01 0.00 - 0.07 K/uL    Comment: Performed at [facility], [address] [place], [place] [address]  Comprehensive metabolic panel with GFR     Status: None   Collection Time: 07/07/23 11:00 AM  Result Value Ref Range    Sodium 138 135 - 145 mmol/L   Potassium 4.1 3.5 - 5.1 mmol/L   Chloride 102 98 - 111 mmol/L   CO2 24 22 - 32 mmol/L   Glucose, Bld 99 70 - 99 mg/dL    Comment: Glucose reference range applies only to samples taken after fasting for at least 8 hours.   BUN 8 6 - 20 mg/dL   Creatinine, Ser 6.21 0.44 - 1.00 mg/dL   Calcium 9.7 8.9 - 30.8 mg/dL   Total Protein 6.9 6.5 - 8.1 g/dL   Albumin 3.9 3.5 - 5.0 g/dL   AST 18 15 - 41 U/L   ALT 19 0 - 44 U/L   Alkaline Phosphatase 46 38 - 126 U/L   Total Bilirubin 0.5 0.0 - 1.2 mg/dL   GFR, Estimated >65 >78 mL/min    Comment: (NOTE) Calculated using the CKD-EPI Creatinine Equation (2021)    Anion gap 12 5 - 15    Comment: Performed at [facility], [address], [place], [place] [address]  hCG, quantitative, pregnancy     Status: Abnormal   Collection Time: 07/07/23 11:00 AM  Result Value Ref Range   hCG, Beta Chain, Quant, S 203 (H) <5 mIU/mL    Comment:          GEST. AGE      CONC.  (mIU/mL)   <=1 WEEK        5 - 50     2 WEEKS       50 - 500     3 WEEKS       100 - 10,000     4 WEEKS     1,000 - 30,000     5 WEEKS     3,500 - 115,000   6-8 WEEKS     12,000 - 270,000    12 WEEKS     15,000 - 220,000        FEMALE AND NON-PREGNANT FEMALE:     LESS  THAN 5 mIU/mL Performed at [facility], [address] [place], [place] [address]   HIV Antibody (routine testing w rflx)     Status: None   Collection Time: 07/07/23 11:00 AM  Result Value Ref Range   HIV Screen 4th Generation wRfx Non Reactive Non Reactive    Comment: Performed at [facility], [address], [place], [place] [address]  Wet prep, genital     Status: None   Collection Time: 07/07/23 11:04 AM  Result Value Ref Range   Yeast Wet Prep HPF POC NONE SEEN NONE SEEN   Trich, Wet Prep NONE SEEN NONE SEEN   Clue Cells Wet Prep HPF POC NONE SEEN NONE SEEN   WBC, Wet Prep HPF POC <10 <10   Sperm NONE SEEN     Comment: Performed at [facility]  Lab, [address] [place], [place] [address]   US OB LESS THAN 14 WEEKS WITH [place] TRANSVAGINAL Result Date: 07/07/2023 CLINICAL DATA:  Vaginal bleeding. Estimated gestational age by last menstrual period equals 5 weeks 4 days EXAM: OBSTETRIC <14 WK [place] AND TRANSVAGINAL OB US TECHNIQUE: Both transabdominal and transvaginal ultrasound examinations were performed for complete evaluation of the gestation as well as the maternal uterus, adnexal regions, and pelvic cul-de-sac. Transvaginal technique was performed to assess early pregnancy. COMPARISON:  None Available. FINDINGS: Intrauterine gestational sac: Present Yolk sac:  Not identified Embryo:  Not identified MSD: 3.7 mm   5 w   1 d Subchorionic hemorrhage: Small subchronic hemorrhage measuring 1.0 x 0.5 x 0.5 cm Maternal uterus/adnexae: Normal RIGHT ovary. LEFT ovary not identified. Trace free fluid. IMPRESSION: Probable early intrauterine gestational sac, but no yolk sac, fetal pole, or cardiac activity yet visualized. Recommend follow-up quantitative B-HCG levels and follow-up US in 14 days to assess viability. This recommendation follows SRU consensus guidelines: Diagnostic Criteria for Nonviable Pregnancy Early in the First Trimester. Malva Limes Med 2013; 102:7253-66. Electronically Signed   By: Genevive Bi M.D.   On: 07/07/2023 12:43     Review of Systems  Constitutional:  Negative for fever.  Gastrointestinal:  Positive for abdominal pain.  Genitourinary:  Positive for vaginal bleeding.   Physical Exam   Blood pressure 121/67, pulse 79, temperature 98.8 F (37.1 C), temperature source Oral, resp. rate 14, height 5\' 2"  (1.575 m), weight 72.3 kg, last menstrual period 05/29/2023, SpO2 100%, unknown if currently breastfeeding.  Physical Exam Constitutional:      General: She is not in acute distress.    Appearance: Normal appearance. She is not ill-appearing, toxic-appearing or diaphoretic.  HENT:     Head: Normocephalic.  Eyes:     Pupils:  Pupils are equal, round, and reactive to light.  Skin:    General: Skin is warm.  Neurological:     Mental Status: She is alert and oriented to person, place, and time.  Psychiatric:        Behavior: Behavior normal.    MAU Course  Procedures None  MDM  Quant down from 700 to 200 Discussed patient with Dr. Salvadore Dom and reviewed follow up plan next week.  Discussed [place] in detail with the patient.   Assessment and Plan   A:  1. Vaginal bleeding in pregnancy, first trimester   2. [redacted] weeks gestation of pregnancy   3. Subchorionic hematoma in first trimester, single or unspecified fetus   4. Threatened miscarriage      P:  Dc home Follow up  with primary OB next week, they will call you to schedule\ B positive blood type Support given  Elora Hales, NP 07/07/2023 4:45 PM

## 2023-07-09 DIAGNOSIS — O200 Threatened abortion: Secondary | ICD-10-CM | POA: Diagnosis not present

## 2023-07-09 LAB — GC/CHLAMYDIA PROBE AMP (~~LOC~~) NOT AT ARMC
Chlamydia: NEGATIVE
Comment: NEGATIVE
Comment: NORMAL
Neisseria Gonorrhea: NEGATIVE

## 2023-07-26 DIAGNOSIS — O039 Complete or unspecified spontaneous abortion without complication: Secondary | ICD-10-CM | POA: Diagnosis not present

## 2023-10-15 DIAGNOSIS — Z3169 Encounter for other general counseling and advice on procreation: Secondary | ICD-10-CM | POA: Diagnosis not present

## 2023-10-15 DIAGNOSIS — N96 Recurrent pregnancy loss: Secondary | ICD-10-CM | POA: Diagnosis not present

## 2023-10-19 DIAGNOSIS — N711 Chronic inflammatory disease of uterus: Secondary | ICD-10-CM | POA: Diagnosis not present

## 2023-10-30 DIAGNOSIS — Z3149 Encounter for other procreative investigation and testing: Secondary | ICD-10-CM | POA: Diagnosis not present

## 2023-10-30 DIAGNOSIS — Z113 Encounter for screening for infections with a predominantly sexual mode of transmission: Secondary | ICD-10-CM | POA: Diagnosis not present

## 2023-10-30 DIAGNOSIS — Z319 Encounter for procreative management, unspecified: Secondary | ICD-10-CM | POA: Diagnosis not present

## 2023-11-01 DIAGNOSIS — Z832 Family history of diseases of the blood and blood-forming organs and certain disorders involving the immune mechanism: Secondary | ICD-10-CM | POA: Diagnosis not present

## 2023-11-01 DIAGNOSIS — E039 Hypothyroidism, unspecified: Secondary | ICD-10-CM | POA: Diagnosis not present

## 2023-11-01 DIAGNOSIS — Z8349 Family history of other endocrine, nutritional and metabolic diseases: Secondary | ICD-10-CM | POA: Diagnosis not present

## 2023-11-01 DIAGNOSIS — Z8759 Personal history of other complications of pregnancy, childbirth and the puerperium: Secondary | ICD-10-CM | POA: Diagnosis not present

## 2023-11-08 DIAGNOSIS — Z3183 Encounter for assisted reproductive fertility procedure cycle: Secondary | ICD-10-CM | POA: Diagnosis not present

## 2023-11-12 DIAGNOSIS — Z3183 Encounter for assisted reproductive fertility procedure cycle: Secondary | ICD-10-CM | POA: Diagnosis not present

## 2023-11-15 DIAGNOSIS — N711 Chronic inflammatory disease of uterus: Secondary | ICD-10-CM | POA: Diagnosis not present

## 2023-11-15 DIAGNOSIS — Z3183 Encounter for assisted reproductive fertility procedure cycle: Secondary | ICD-10-CM | POA: Diagnosis not present

## 2023-11-19 ENCOUNTER — Ambulatory Visit: Admitting: Dermatology

## 2023-11-28 ENCOUNTER — Ambulatory Visit: Admitting: Physician Assistant

## 2023-11-28 ENCOUNTER — Encounter: Payer: Self-pay | Admitting: Physician Assistant

## 2023-11-28 DIAGNOSIS — Z1283 Encounter for screening for malignant neoplasm of skin: Secondary | ICD-10-CM

## 2023-11-28 DIAGNOSIS — L409 Psoriasis, unspecified: Secondary | ICD-10-CM | POA: Diagnosis not present

## 2023-11-28 DIAGNOSIS — L814 Other melanin hyperpigmentation: Secondary | ICD-10-CM

## 2023-11-28 DIAGNOSIS — L821 Other seborrheic keratosis: Secondary | ICD-10-CM

## 2023-11-28 DIAGNOSIS — Z808 Family history of malignant neoplasm of other organs or systems: Secondary | ICD-10-CM | POA: Diagnosis not present

## 2023-11-28 DIAGNOSIS — W908XXA Exposure to other nonionizing radiation, initial encounter: Secondary | ICD-10-CM

## 2023-11-28 DIAGNOSIS — D489 Neoplasm of uncertain behavior, unspecified: Secondary | ICD-10-CM

## 2023-11-28 DIAGNOSIS — D229 Melanocytic nevi, unspecified: Secondary | ICD-10-CM

## 2023-11-28 DIAGNOSIS — D492 Neoplasm of unspecified behavior of bone, soft tissue, and skin: Secondary | ICD-10-CM

## 2023-11-28 DIAGNOSIS — D2271 Melanocytic nevi of right lower limb, including hip: Secondary | ICD-10-CM | POA: Diagnosis not present

## 2023-11-28 DIAGNOSIS — L578 Other skin changes due to chronic exposure to nonionizing radiation: Secondary | ICD-10-CM | POA: Diagnosis not present

## 2023-11-28 DIAGNOSIS — D1801 Hemangioma of skin and subcutaneous tissue: Secondary | ICD-10-CM

## 2023-11-28 NOTE — Patient Instructions (Signed)

## 2023-11-28 NOTE — Progress Notes (Signed)
 New Patient Visit   Subjective  Rebecca Pacheco is a 43 y.o. female NEW PATIENT who presents for the following: full body check  Patient states she has a few spots located from back to legs she would like to have examined. Patient reports the areas have been there for 2 years. She reports the areas are bothersome the spots on her back itch. Patient rates irritation 4 out of 10. She states that the areas have not spread. Patient reports she has not previously been treated for these areas. Patient admits to family history of skin cancer(s). Pt is currently trying to conceive through [facility] right now. Pt reports known psoriasis on her scalp and is treating it with clobetasol drops.  The patient has spots, moles and lesions to be evaluated, some may be new or changing and the patient may have concern these could be cancer.   The following portions of the chart were reviewed this encounter and updated as appropriate: medications, allergies, medical history  Review of Systems:  No other skin or systemic complaints except as noted in HPI or Assessment and Plan.  Objective  Well appearing patient in no apparent distress; mood and affect are within normal limits.  A full examination was performed including scalp, head, eyes, ears, nose, lips, neck, chest, axillae, abdomen, back, buttocks, bilateral upper extremities, bilateral lower extremities, hands, feet, fingers, toes, fingernails, and toenails. All findings within normal limits unless otherwise noted below.   Relevant exam findings are noted in the Assessment and Plan.  Right Thigh - Posterior 0.6 Irritated nevus flesh colored papule   Assessment & Plan   PSORIASIS Exam: minimal to moderate scaly patches (posterior scalp)    Psoriasis is a chronic non-curable, but treatable genetic/hereditary disease that may have other systemic features affecting other organ systems such as joints (Psoriatic Arthritis). It is associated with an  increased risk of inflammatory bowel disease, heart disease, non-alcoholic fatty liver disease, and depression.  Treatments include light and laser treatments; topical medications; and systemic medications including oral and injectables.  Treatment Plan: She has clobetasol scalp solution but is not using as she is trying to conceive.    LENTIGINES, SEBORRHEIC KERATOSES, HEMANGIOMAS - Benign normal skin lesions - Benign-appearing - Call for any changes  MELANOCYTIC NEVI - Tan-brown and/or pink-flesh-colored symmetric macules and papules - Benign appearing on exam today - Observation - Call clinic for new or changing moles - Recommend daily use of broad spectrum spf 30+ sunscreen to sun-exposed areas.   ACTINIC DAMAGE - Chronic condition, secondary to cumulative UV/sun exposure - diffuse scaly erythematous macules with underlying dyspigmentation - Recommend daily broad spectrum sunscreen SPF 30+ to sun-exposed areas, reapply every 2 hours as needed.  - Staying in the shade or wearing long sleeves, sun glasses (UVA+UVB protection) and wide brim hats (4-inch brim around the entire circumference of the hat) are also recommended for sun protection.  - Call for new or changing lesions.  SKIN CANCER SCREENING PERFORMED TODAY   NEOPLASM OF UNCERTAIN BEHAVIOR Right Thigh - Posterior Epidermal / dermal shaving  Lesion diameter (cm):  0.6 Informed consent: discussed and consent obtained   Timeout: patient name, date of birth, surgical site, and procedure verified   Procedure prep:  Patient was prepped and draped in usual sterile fashion Prep type:  Isopropyl alcohol Anesthesia: the lesion was anesthetized in a standard fashion   Anesthetic:  1% lidocaine w/ epinephrine 1-100,000 buffered w/ 8.4% NaHCO3 Instrument used: flexible razor blade   Hemostasis  achieved with: pressure, aluminum chloride and electrodesiccation   Outcome: patient tolerated procedure well   Post-procedure details:  sterile dressing applied and wound care instructions given   Dressing type: bandage and petrolatum    Specimen 1 - Surgical pathology Differential Diagnosis:  irritated nevus  Check Margins: No PSORIASIS   SCREENING EXAM FOR SKIN CANCER   LENTIGINES   SEBORRHEIC KERATOSIS   CHERRY ANGIOMA   MULTIPLE BENIGN NEVI   ACTINIC SKIN DAMAGE    Return in about 1 year (around 11/27/2024) for TBSE.  I, Doyce Pan, CMA, am acting as scribe for Hunt Zajicek K, PA-C.   Documentation: I have reviewed the above documentation for accuracy and completeness, and I agree with the above.  Ladelle Teodoro K, PA-C

## 2023-11-29 ENCOUNTER — Telehealth: Admitting: Physician Assistant

## 2023-11-29 ENCOUNTER — Telehealth: Admitting: Nurse Practitioner

## 2023-11-29 ENCOUNTER — Telehealth: Payer: Self-pay | Admitting: Family Medicine

## 2023-11-29 DIAGNOSIS — J069 Acute upper respiratory infection, unspecified: Secondary | ICD-10-CM

## 2023-11-29 DIAGNOSIS — H9202 Otalgia, left ear: Secondary | ICD-10-CM

## 2023-11-29 DIAGNOSIS — E039 Hypothyroidism, unspecified: Secondary | ICD-10-CM | POA: Diagnosis not present

## 2023-11-29 MED ORDER — IPRATROPIUM BROMIDE 0.03 % NA SOLN: 2.0000 | Freq: Two times a day (BID) | NASAL | 12 refills | Status: DC

## 2023-11-29 MED ORDER — CEFDINIR 300 MG PO CAPS: 300.0000 mg | ORAL_CAPSULE | Freq: Two times a day (BID) | ORAL | 0 refills | Status: AC

## 2023-11-29 NOTE — Patient Instructions (Signed)
 [name], thank you for joining [name], PA-C for today's virtual visit.  While this provider is not your primary care provider (PCP), if your PCP is located in our provider database this encounter information will be shared with them immediately following your visit.   A [facility] MyChart account gives you access to today's visit and all your visits, tests, and labs performed at [facility]  click here if you don't have a [facility] MyChart account or go to [email or web address]  Consent: (Patient) Rebecca Pacheco provided verbal consent for this virtual visit at the beginning of the encounter.  Current Medications:  Current Outpatient Medications:    cefdinir  (OMNICEF ) 300 MG capsule, Take 1 capsule (300 mg total) by mouth 2 (two) times daily., Disp: 20 capsule, Rfl: 0   [employer] FIBER GUMMIES PO, Take 2 each by mouth daily., Disp: , Rfl:    levothyroxine (SYNTHROID) 100 MCG tablet, , Disp: , Rfl:    OVER THE COUNTER MEDICATION, Take 2 capsules by mouth daily. Algae, Disp: , Rfl:    Prenatal Vit-Fe Fumarate-FA (PRENATAL MULTIVITAMIN) TABS tablet, Take 1 tablet by mouth daily at 12 noon., Disp: , Rfl:    Medications ordered in this encounter:  Meds ordered this encounter  Medications   cefdinir  (OMNICEF ) 300 MG capsule    Sig: Take 1 capsule (300 mg total) by mouth 2 (two) times daily.    Dispense:  20 capsule    Refill:  0    Supervising Provider:   BLAISE ALEENE KIDD [[id number]]     *If you need refills on other medications prior to your next appointment, please contact your pharmacy*  Follow-Up: Call back or seek an in-person evaluation if the symptoms worsen or if the condition fails to improve as anticipated.  [facility] [phone]  Other Instructions Otitis Media, Adult  Otitis media is a condition in which the middle ear is red and swollen (inflamed) and full of fluid. The middle ear is the part of the ear that  contains bones for hearing as well as air that helps send sounds to the brain. The condition usually goes away on its own. What are the causes? This condition is caused by a blockage in the eustachian tube. This tube connects the middle ear to the back of the nose. It normally allows air into the middle ear. The blockage is caused by fluid or swelling. Problems that can cause blockage include: A cold or infection that affects the nose, mouth, or throat. Allergies. An irritant, such as tobacco smoke. Adenoids that have become large. The adenoids are soft tissue located in the back of the throat, behind the nose and the roof of the mouth. Growth or swelling in the upper part of the throat, just behind the nose (nasopharynx). Damage to the ear caused by a change in pressure. This is called barotrauma. What increases the risk? You are more likely to develop this condition if you: Smoke or are exposed to tobacco smoke. Have an opening in the roof of your mouth (cleft palate). Have acid reflux. Have problems in your body's defense system (immune system). What are the signs or symptoms? Symptoms of this condition include: Ear pain. Fever. Problems with hearing. Being tired. Fluid leaking from the ear. Ringing in the ear. How is this treated? This condition can go away on its own within 3-5 days. But if the condition is caused by germs (bacteria) and does not go away on its own, or  if it keeps coming back, your doctor may: Give you antibiotic medicines. Give you medicines for pain. Follow these instructions at home: Take over-the-counter and prescription medicines only as told by your doctor. If you were prescribed an antibiotic medicine, take it as told by your doctor. Do not stop taking it even if you start to feel better. Keep all follow-up visits. Contact a doctor if: You have bleeding from your nose. There is a lump on your neck. You are not feeling better in 5 days. You feel worse  instead of better. Get help right away if: You have pain that is not helped with medicine. You have swelling, redness, or pain around your ear. You get a stiff neck. You cannot move part of your face (paralysis). You notice that the bone behind your ear hurts when you touch it. You get a very bad headache. Summary Otitis media means that the middle ear is red, swollen, and full of fluid. This condition usually goes away on its own. If the problem does not go away, treatment may be needed. You may be given medicines to treat the infection or to treat your pain. If you were prescribed an antibiotic medicine, take it as told by your doctor. Do not stop taking it even if you start to feel better. Keep all follow-up visits. This information is not intended to replace advice given to you by your health care provider. Make sure you discuss any questions you have with your health care provider. Document Revised: 06/21/2020 Document Reviewed: 06/21/2020 Elsevier Patient Education  2024 [employer]vier [employer].   If you have been instructed to have an in-person evaluation today at a local Urgent Care facility, please use the link below. It will take you to a list of all of our available [facility], including address, phone number and hours of operation. Please do not delay care.  [facility]  If you or a family member do not have a primary care provider, use the link below to schedule a visit and establish care. When you choose a [facility] primary care physician or advanced practice provider, you gain a long-term partner in health. Find a Primary Care Provider  Learn more about [facility] in-office and virtual care options: [facility] - Get Care Now

## 2023-11-29 NOTE — Addendum Note (Signed)
 Addended by: KENNYTH DOMINO E on: 11/29/2023 09:04 AM   Modules accepted: Level of Service

## 2023-11-29 NOTE — Progress Notes (Signed)

## 2023-11-29 NOTE — Telephone Encounter (Signed)
 Saw Dr. Catherine once for new pt appt - Looks to be a current pt of NP Geni Metro with [facility] in [place]. She was able to get seen through Telehealth - I'm not sure who else she wants to speak to. I'm only forwarding it to your office because the CRM was sent to our office by mistake.  Copied from CRM #[id number]. Topic: General - Inquiry >> Nov 29, 2023  8:24 AM [place] wrote:   Reason for CRM: Patient called wanting to be seen today- she said she had covid. She has not seen Dr Catherine since 2019 and was upset that she could not get an appointment. I explained that she would be a new patient and those visits were scheduling out a few months. Offered for her to speak to a triage nurse, she declined-asked if there was anything else I could do for her- she declined and said she is going to speak to someone and complain that we could not see her.[email or web address]

## 2023-11-29 NOTE — Progress Notes (Signed)
 Virtual Visit Consent   Rebecca Pacheco, you are scheduled for a virtual visit with a [facility] provider today. Just as with appointments in the office, your consent must be obtained to participate. Your consent will be active for this visit and any virtual visit you may have with one of our providers in the next 365 days. If you have a MyChart account, a copy of this consent can be sent to you electronically.  As this is a virtual visit, video technology does not allow for your provider to perform a traditional examination. This may limit your provider's ability to fully assess your condition. If your provider identifies any concerns that need to be evaluated in person or the need to arrange testing (such as labs, EKG, etc.), we will make arrangements to do so. Although advances in technology are sophisticated, we cannot ensure that it will always work on either your end or our end. If the connection with a video visit is poor, the visit may have to be switched to a telephone visit. With either a video or telephone visit, we are not always able to ensure that we have a secure connection.  By engaging in this virtual visit, you consent to the provision of healthcare and authorize for your insurance to be billed (if applicable) for the services provided during this visit. Depending on your insurance coverage, you may receive a charge related to this service.  I need to obtain your verbal consent now. Are you willing to proceed with your visit today? Rebecca Pacheco has provided verbal consent on 11/29/2023 for a virtual visit (video or telephone). [name], [place]  Date: 11/29/2023 9:26 AM   Virtual Visit via Video Note   I, [name], connected with  Rebecca Pacheco  ([id number], 1980-06-10) on 11/29/23 at  9:30 AM EDT by a video-enabled telemedicine application and verified that I am speaking with the correct person using two identifiers.  Location: Patient: Virtual Visit  Location Patient: Home Provider: Virtual Visit Location Provider: Home Office   I discussed the limitations of evaluation and management by telemedicine and the availability of in person appointments. The patient expressed understanding and agreed to proceed.    History of Present Illness: Rebecca Pacheco is a 43 y.o. who identifies as a female who was assigned female at birth, and is being seen today for substantial L-sided facial and ear pain over past 24 hours that has become consistent. Notes associated muffled hearing. This is following a few days of nasal congestion, sinus pressure and sore throat. Initially also had fever and chills but resolved. Notes history of ear infections throughout childhood (requirind tube placement) and even adulthood. Notes when she gets a cold she either gets over it very quickly or gets an ear infection. Denies noted symptoms of other ear.   HPI: HPI  Problems:  Patient Active Problem List   Diagnosis Date Noted   Breech presentation 05/15/2020   Hallux limitus of left foot 09/15/2019   Tendinitis of both ankles 09/15/2019   Weight gain 09/21/2017   Other fatigue 09/21/2017   Psoriasis 09/21/2017   Polyarthralgia 09/21/2017   Myalgia 09/21/2017   Family planning advice 09/21/2017    Allergies:  Allergies  Allergen Reactions   Poison Oak Extract Hives and Itching   Mango Flavoring Agent (Non-Screening) Rash    Rash around lips   Poison Ivy Extract    Proparacaine     hypotension   Medications:  Current Outpatient Medications:  cefdinir  (OMNICEF ) 300 MG capsule, Take 1 capsule (300 mg total) by mouth 2 (two) times daily., Disp: 20 capsule, Rfl: 0   [employer] FIBER GUMMIES PO, Take 2 each by mouth daily., Disp: , Rfl:    levothyroxine (SYNTHROID) 100 MCG tablet, , Disp: , Rfl:    OVER THE COUNTER MEDICATION, Take 2 capsules by mouth daily. Algae, Disp: , Rfl:    Prenatal Vit-Fe Fumarate-FA (PRENATAL MULTIVITAMIN) TABS tablet, Take 1 tablet by  mouth daily at 12 noon., Disp: , Rfl:   Observations/Objective: Patient is well-developed, well-nourished in no acute distress.  Resting comfortably at home.  Head is normocephalic, atraumatic.  No labored breathing.  Speech is clear and coherent with logical content.  Patient is alert and oriented at baseline.   Assessment and Plan: 1. Ear pain, left (Primary) - cefdinir  (OMNICEF ) 300 MG capsule; Take 1 capsule (300 mg total) by mouth 2 (two) times daily.  Dispense: 20 capsule; Refill: 0  Concern for L AOM giving history. Notes Amox has not been the most effective for her in the past. No concern for pregnancy currently. Rx Cefdinir  300 mg BID x 10 days. Supportive measures and OTC medications reviewed.   Follow Up Instructions: I discussed the assessment and treatment plan with the patient. The patient was provided an opportunity to ask questions and all were answered. The patient agreed with the plan and demonstrated an understanding of the instructions.  A copy of instructions were sent to the patient via MyChart unless otherwise noted below.   The patient was advised to call back or seek an in-person evaluation if the symptoms worsen or if the condition fails to improve as anticipated.    [name], PA-C

## 2023-11-29 NOTE — Progress Notes (Signed)
 Patient prefers VV will remove charge for EV

## 2023-11-29 NOTE — Telephone Encounter (Signed)
 Pt was seen by virtual visit by another provider today.

## 2023-11-30 LAB — SURGICAL PATHOLOGY

## 2023-12-03 ENCOUNTER — Ambulatory Visit: Payer: Self-pay | Admitting: Physician Assistant

## 2023-12-07 DIAGNOSIS — J029 Acute pharyngitis, unspecified: Secondary | ICD-10-CM | POA: Diagnosis not present

## 2023-12-07 DIAGNOSIS — R0981 Nasal congestion: Secondary | ICD-10-CM | POA: Diagnosis not present

## 2023-12-07 DIAGNOSIS — H6692 Otitis media, unspecified, left ear: Secondary | ICD-10-CM | POA: Diagnosis not present

## 2023-12-10 DIAGNOSIS — H6592 Unspecified nonsuppurative otitis media, left ear: Secondary | ICD-10-CM | POA: Diagnosis not present

## 2023-12-10 DIAGNOSIS — H9012 Conductive hearing loss, unilateral, left ear, with unrestricted hearing on the contralateral side: Secondary | ICD-10-CM | POA: Diagnosis not present

## 2023-12-12 DIAGNOSIS — Z3149 Encounter for other procreative investigation and testing: Secondary | ICD-10-CM | POA: Diagnosis not present

## 2023-12-12 DIAGNOSIS — Z31438 Encounter for other genetic testing of female for procreative management: Secondary | ICD-10-CM | POA: Diagnosis not present

## 2023-12-18 DIAGNOSIS — M2550 Pain in unspecified joint: Secondary | ICD-10-CM | POA: Diagnosis not present

## 2023-12-18 DIAGNOSIS — Z1231 Encounter for screening mammogram for malignant neoplasm of breast: Secondary | ICD-10-CM | POA: Diagnosis not present

## 2023-12-18 DIAGNOSIS — M25561 Pain in right knee: Secondary | ICD-10-CM | POA: Diagnosis not present

## 2023-12-18 DIAGNOSIS — Z6829 Body mass index (BMI) 29.0-29.9, adult: Secondary | ICD-10-CM | POA: Diagnosis not present

## 2023-12-18 DIAGNOSIS — H6992 Unspecified Eustachian tube disorder, left ear: Secondary | ICD-10-CM | POA: Diagnosis not present

## 2023-12-27 DIAGNOSIS — E039 Hypothyroidism, unspecified: Secondary | ICD-10-CM | POA: Diagnosis not present

## 2023-12-27 DIAGNOSIS — R7689 Other specified abnormal immunological findings in serum: Secondary | ICD-10-CM | POA: Diagnosis not present

## 2023-12-27 DIAGNOSIS — Z8349 Family history of other endocrine, nutritional and metabolic diseases: Secondary | ICD-10-CM | POA: Diagnosis not present

## 2023-12-27 DIAGNOSIS — Z832 Family history of diseases of the blood and blood-forming organs and certain disorders involving the immune mechanism: Secondary | ICD-10-CM | POA: Diagnosis not present

## 2023-12-27 DIAGNOSIS — E063 Autoimmune thyroiditis: Secondary | ICD-10-CM | POA: Diagnosis not present

## 2024-01-03 DIAGNOSIS — Z1231 Encounter for screening mammogram for malignant neoplasm of breast: Secondary | ICD-10-CM | POA: Diagnosis not present

## 2024-01-09 DIAGNOSIS — Z319 Encounter for procreative management, unspecified: Secondary | ICD-10-CM | POA: Diagnosis not present

## 2024-01-11 DIAGNOSIS — N390 Urinary tract infection, site not specified: Secondary | ICD-10-CM | POA: Diagnosis not present

## 2024-01-11 DIAGNOSIS — Z6829 Body mass index (BMI) 29.0-29.9, adult: Secondary | ICD-10-CM | POA: Diagnosis not present

## 2024-01-11 DIAGNOSIS — R399 Unspecified symptoms and signs involving the genitourinary system: Secondary | ICD-10-CM | POA: Diagnosis not present

## 2024-01-25 DIAGNOSIS — Z3200 Encounter for pregnancy test, result unknown: Secondary | ICD-10-CM | POA: Diagnosis not present

## 2024-01-25 DIAGNOSIS — E039 Hypothyroidism, unspecified: Secondary | ICD-10-CM | POA: Diagnosis not present

## 2024-01-28 DIAGNOSIS — Z3200 Encounter for pregnancy test, result unknown: Secondary | ICD-10-CM | POA: Diagnosis not present

## 2024-01-29 ENCOUNTER — Other Ambulatory Visit (HOSPITAL_COMMUNITY): Payer: Self-pay

## 2024-02-05 DIAGNOSIS — I7300 Raynaud's syndrome without gangrene: Secondary | ICD-10-CM | POA: Diagnosis not present

## 2024-02-05 DIAGNOSIS — Z6829 Body mass index (BMI) 29.0-29.9, adult: Secondary | ICD-10-CM | POA: Diagnosis not present

## 2024-02-05 DIAGNOSIS — R7689 Other specified abnormal immunological findings in serum: Secondary | ICD-10-CM | POA: Diagnosis not present

## 2024-02-05 DIAGNOSIS — E663 Overweight: Secondary | ICD-10-CM | POA: Diagnosis not present

## 2024-02-05 DIAGNOSIS — H04123 Dry eye syndrome of bilateral lacrimal glands: Secondary | ICD-10-CM | POA: Diagnosis not present

## 2024-02-05 DIAGNOSIS — R682 Dry mouth, unspecified: Secondary | ICD-10-CM | POA: Diagnosis not present

## 2024-02-09 ENCOUNTER — Other Ambulatory Visit (HOSPITAL_COMMUNITY): Payer: Self-pay

## 2024-02-09 MED ORDER — LEVOTHYROXINE SODIUM 125 MCG PO TABS
125.0000 ug | ORAL_TABLET | Freq: Every day | ORAL | 5 refills | Status: AC
  Filled 2024-02-09: qty 90, 90d supply, fill #0
  Filled 2024-02-11: qty 30, 30d supply, fill #0
  Filled 2024-03-15: qty 30, 30d supply, fill #1
  Filled 2024-04-12: qty 30, 30d supply, fill #2

## 2024-02-11 ENCOUNTER — Other Ambulatory Visit (HOSPITAL_COMMUNITY): Payer: Self-pay

## 2024-02-11 ENCOUNTER — Other Ambulatory Visit: Payer: Self-pay

## 2024-02-12 ENCOUNTER — Ambulatory Visit (HOSPITAL_BASED_OUTPATIENT_CLINIC_OR_DEPARTMENT_OTHER): Admitting: Family Medicine

## 2024-02-12 DIAGNOSIS — Z3201 Encounter for pregnancy test, result positive: Secondary | ICD-10-CM | POA: Diagnosis not present

## 2024-02-13 ENCOUNTER — Other Ambulatory Visit (HOSPITAL_COMMUNITY): Payer: Self-pay

## 2024-02-13 ENCOUNTER — Encounter (HOSPITAL_COMMUNITY): Payer: Self-pay

## 2024-02-13 ENCOUNTER — Other Ambulatory Visit: Payer: Self-pay

## 2024-02-14 ENCOUNTER — Other Ambulatory Visit: Payer: Self-pay

## 2024-02-14 ENCOUNTER — Other Ambulatory Visit (HOSPITAL_COMMUNITY): Payer: Self-pay

## 2024-02-14 DIAGNOSIS — E039 Hypothyroidism, unspecified: Secondary | ICD-10-CM | POA: Diagnosis not present

## 2024-02-18 ENCOUNTER — Ambulatory Visit: Admitting: Dermatology

## 2024-02-19 ENCOUNTER — Other Ambulatory Visit (HOSPITAL_COMMUNITY): Payer: Self-pay

## 2024-02-26 DIAGNOSIS — O0901 Supervision of pregnancy with history of infertility, first trimester: Secondary | ICD-10-CM | POA: Diagnosis not present

## 2024-03-13 DIAGNOSIS — E039 Hypothyroidism, unspecified: Secondary | ICD-10-CM | POA: Diagnosis not present

## 2024-03-15 ENCOUNTER — Other Ambulatory Visit (HOSPITAL_COMMUNITY): Payer: Self-pay

## 2024-03-17 ENCOUNTER — Other Ambulatory Visit: Payer: Self-pay

## 2024-03-18 ENCOUNTER — Other Ambulatory Visit (HOSPITAL_COMMUNITY): Payer: Self-pay

## 2024-03-18 MED ORDER — LEVOTHYROXINE SODIUM 125 MCG PO TABS: 125.0000 ug | ORAL_TABLET | Freq: Every day | ORAL | 3 refills | Status: AC

## 2024-03-19 ENCOUNTER — Other Ambulatory Visit (HOSPITAL_COMMUNITY): Payer: Self-pay

## 2024-03-21 ENCOUNTER — Other Ambulatory Visit (HOSPITAL_COMMUNITY): Payer: Self-pay

## 2024-03-24 ENCOUNTER — Other Ambulatory Visit (HOSPITAL_COMMUNITY): Payer: Self-pay

## 2024-03-24 ENCOUNTER — Other Ambulatory Visit: Payer: Self-pay

## 2024-04-14 ENCOUNTER — Other Ambulatory Visit: Payer: Self-pay

## 2024-04-20 ENCOUNTER — Other Ambulatory Visit (HOSPITAL_COMMUNITY): Payer: Self-pay

## 2024-04-21 ENCOUNTER — Other Ambulatory Visit: Payer: Self-pay

## 2024-04-21 ENCOUNTER — Other Ambulatory Visit (HOSPITAL_COMMUNITY): Payer: Self-pay

## 2024-04-21 MED ORDER — LEVOTHYROXINE SODIUM 100 MCG PO TABS
100.0000 ug | ORAL_TABLET | Freq: Every day | ORAL | 12 refills | Status: AC
  Filled 2024-04-21: qty 90, 90d supply, fill #0

## 2024-04-21 MED ORDER — LEVOTHYROXINE SODIUM 112 MCG PO TABS
112.0000 ug | ORAL_TABLET | Freq: Every day | ORAL | 4 refills | Status: AC
  Filled 2024-04-21: qty 90, 90d supply, fill #0

## 2024-12-08 ENCOUNTER — Ambulatory Visit: Admitting: Physician Assistant
